# Patient Record
Sex: Female | Born: 1945 | Race: White | Hispanic: No | Smoking: Former smoker
Health system: Southern US, Community
[De-identification: ages and names within clinical notes are randomized; demographics above are authoritative.]

## PROBLEM LIST (undated history)

## (undated) DIAGNOSIS — G459 Transient cerebral ischemic attack, unspecified: Secondary | ICD-10-CM

## (undated) DIAGNOSIS — E785 Hyperlipidemia, unspecified: Secondary | ICD-10-CM

## (undated) DIAGNOSIS — H9313 Tinnitus, bilateral: Secondary | ICD-10-CM

## (undated) DIAGNOSIS — N200 Calculus of kidney: Secondary | ICD-10-CM

## (undated) DIAGNOSIS — I10 Essential (primary) hypertension: Secondary | ICD-10-CM

## (undated) DIAGNOSIS — E119 Type 2 diabetes mellitus without complications: Secondary | ICD-10-CM

## (undated) DIAGNOSIS — I4510 Unspecified right bundle-branch block: Secondary | ICD-10-CM

## (undated) DIAGNOSIS — I639 Cerebral infarction, unspecified: Secondary | ICD-10-CM

## (undated) HISTORY — PX: MENISECTOMY: SHX5181

## (undated) HISTORY — DX: Cerebral infarction, unspecified: I63.9

## (undated) HISTORY — DX: Type 2 diabetes mellitus without complications: E11.9

## (undated) HISTORY — PX: CATARACT EXTRACTION, BILATERAL: SHX1313

## (undated) HISTORY — PX: ABDOMINAL HYSTERECTOMY: SHX81

## (undated) HISTORY — PX: TONSILLECTOMY: SUR1361

## (undated) HISTORY — DX: Essential (primary) hypertension: I10

## (undated) HISTORY — DX: Transient cerebral ischemic attack, unspecified: G45.9

## (undated) HISTORY — PX: OTHER SURGICAL HISTORY: SHX169

---

## 2013-03-22 DIAGNOSIS — I639 Cerebral infarction, unspecified: Secondary | ICD-10-CM

## 2013-03-22 HISTORY — DX: Cerebral infarction, unspecified: I63.9

## 2015-04-03 DIAGNOSIS — I10 Essential (primary) hypertension: Secondary | ICD-10-CM | POA: Diagnosis not present

## 2015-04-03 DIAGNOSIS — Z23 Encounter for immunization: Secondary | ICD-10-CM | POA: Diagnosis not present

## 2015-04-03 DIAGNOSIS — Z79899 Other long term (current) drug therapy: Secondary | ICD-10-CM | POA: Diagnosis not present

## 2015-04-03 DIAGNOSIS — Z6836 Body mass index (BMI) 36.0-36.9, adult: Secondary | ICD-10-CM | POA: Diagnosis not present

## 2015-04-03 DIAGNOSIS — I4510 Unspecified right bundle-branch block: Secondary | ICD-10-CM | POA: Diagnosis not present

## 2015-04-03 DIAGNOSIS — E1149 Type 2 diabetes mellitus with other diabetic neurological complication: Secondary | ICD-10-CM | POA: Diagnosis not present

## 2015-04-03 DIAGNOSIS — E1140 Type 2 diabetes mellitus with diabetic neuropathy, unspecified: Secondary | ICD-10-CM | POA: Diagnosis not present

## 2015-04-03 DIAGNOSIS — M859 Disorder of bone density and structure, unspecified: Secondary | ICD-10-CM | POA: Diagnosis not present

## 2015-04-03 DIAGNOSIS — M8588 Other specified disorders of bone density and structure, other site: Secondary | ICD-10-CM | POA: Diagnosis not present

## 2015-04-03 DIAGNOSIS — G459 Transient cerebral ischemic attack, unspecified: Secondary | ICD-10-CM | POA: Diagnosis not present

## 2015-04-29 ENCOUNTER — Other Ambulatory Visit: Payer: Self-pay

## 2015-04-29 DIAGNOSIS — Z1231 Encounter for screening mammogram for malignant neoplasm of breast: Secondary | ICD-10-CM

## 2015-05-13 DIAGNOSIS — H04123 Dry eye syndrome of bilateral lacrimal glands: Secondary | ICD-10-CM | POA: Diagnosis not present

## 2015-05-13 DIAGNOSIS — H2511 Age-related nuclear cataract, right eye: Secondary | ICD-10-CM | POA: Diagnosis not present

## 2015-05-13 DIAGNOSIS — H2512 Age-related nuclear cataract, left eye: Secondary | ICD-10-CM | POA: Diagnosis not present

## 2015-05-14 ENCOUNTER — Ambulatory Visit
Admission: RE | Admit: 2015-05-14 | Discharge: 2015-05-14 | Disposition: A | Discharge status: Home / Self Care | Payer: Commercial Managed Care - HMO | Source: Ambulatory Visit

## 2015-05-14 DIAGNOSIS — Z1231 Encounter for screening mammogram for malignant neoplasm of breast: Secondary | ICD-10-CM | POA: Diagnosis not present

## 2015-05-28 DIAGNOSIS — H269 Unspecified cataract: Secondary | ICD-10-CM | POA: Diagnosis not present

## 2015-05-28 DIAGNOSIS — H2511 Age-related nuclear cataract, right eye: Secondary | ICD-10-CM | POA: Diagnosis not present

## 2015-07-03 DIAGNOSIS — E559 Vitamin D deficiency, unspecified: Secondary | ICD-10-CM | POA: Diagnosis not present

## 2015-09-09 DIAGNOSIS — H2512 Age-related nuclear cataract, left eye: Secondary | ICD-10-CM | POA: Diagnosis not present

## 2015-09-09 DIAGNOSIS — H04123 Dry eye syndrome of bilateral lacrimal glands: Secondary | ICD-10-CM | POA: Diagnosis not present

## 2015-09-09 DIAGNOSIS — Z961 Presence of intraocular lens: Secondary | ICD-10-CM | POA: Diagnosis not present

## 2015-09-30 DIAGNOSIS — N3000 Acute cystitis without hematuria: Secondary | ICD-10-CM | POA: Diagnosis not present

## 2015-09-30 DIAGNOSIS — N200 Calculus of kidney: Secondary | ICD-10-CM | POA: Diagnosis not present

## 2015-10-06 DIAGNOSIS — I4510 Unspecified right bundle-branch block: Secondary | ICD-10-CM | POA: Diagnosis not present

## 2015-10-06 DIAGNOSIS — R3129 Other microscopic hematuria: Secondary | ICD-10-CM | POA: Diagnosis not present

## 2015-10-06 DIAGNOSIS — M25569 Pain in unspecified knee: Secondary | ICD-10-CM | POA: Diagnosis not present

## 2015-10-06 DIAGNOSIS — Z1211 Encounter for screening for malignant neoplasm of colon: Secondary | ICD-10-CM | POA: Diagnosis not present

## 2015-10-06 DIAGNOSIS — E119 Type 2 diabetes mellitus without complications: Secondary | ICD-10-CM | POA: Diagnosis not present

## 2015-10-06 DIAGNOSIS — M810 Age-related osteoporosis without current pathological fracture: Secondary | ICD-10-CM | POA: Diagnosis not present

## 2015-10-09 ENCOUNTER — Telehealth: Payer: Self-pay | Admitting: Cardiovascular Disease

## 2015-10-09 NOTE — Telephone Encounter (Signed)
Received records from [place] for appointment on 11/11/15 with Dr Oval Linsey.  Records given to [facility] (medical records) for Dr Blenda Mounts schedule on 11/11/15. lp

## 2015-10-28 DIAGNOSIS — H2512 Age-related nuclear cataract, left eye: Secondary | ICD-10-CM | POA: Diagnosis not present

## 2015-10-29 DIAGNOSIS — H25812 Combined forms of age-related cataract, left eye: Secondary | ICD-10-CM | POA: Diagnosis not present

## 2015-10-29 DIAGNOSIS — H2512 Age-related nuclear cataract, left eye: Secondary | ICD-10-CM | POA: Diagnosis not present

## 2015-11-10 NOTE — Progress Notes (Signed)
Cardiology Office Note   Date:  11/11/2015   ID:  Stephanie Luna, DOB 16-Jan-1946, MRN [id number]  PCP:  Stephanie Coma, MD  Cardiologist:   Skeet Latch, MD   Chief Complaint  Patient presents with  . New Patient (Initial Visit)    History of Present Illness: Stephanie Luna is a 70 y.o. female with stroke, diabetes, RBBB,  hypertension, and hyperlipidemia who presents to establish care.  Ms. Holohan had a pre-operative EKG in 2012 and was noted to have a RBBB.  She had a LHC that was reportedly negative for obstructive coronary disease.  She notes occasional episodes of chest pain that she attributes to her hiatal hernia. It occurs randomly and is not associated with with shortness of breath.  It also does not occur with exertion.  She does not get much formal exercise but she walks the dog regularly and does a lot of yard work.  She is limited by knee pain.  Lately [name] has noted increased swelling in her ankles.  She denies orthopnea or PND.    Past Medical History:  Diagnosis Date  . Diabetes mellitus without complication ([place])   . Hypertension     No past surgical history on file.   Current Outpatient Prescriptions  Medication Sig Dispense Refill  . acetaminophen (TYLENOL) 500 MG tablet 2 tablets as needed    . Blood Glucose Monitoring Suppl (TRUE METRIX METER) w/Device KIT as directed    . Calcium Citrate-Vitamin D (CALCIUM CITRATE + D) 315-250 MG-UNIT TABS 1 tablet    . clopidogrel (PLAVIX) 75 MG tablet 1 tablet    . glucose blood (TRUE METRIX BLOOD GLUCOSE TEST) test strip as directed    . ketorolac (ACULAR) 0.4 % SOLN     . losartan (COZAAR) 50 MG tablet 1 tablet    . metoprolol succinate (TOPROL-XL) 50 MG 24 hr tablet TAKE 1 AND 1/2 TABLETS EVERY DAY    . MICROLET LANCETS MISC as directed    . omeprazole (PRILOSEC) 40 MG capsule 1 capsule    . simvastatin (ZOCOR) 40 MG tablet 1 tablet in the evening     No current facility-administered medications  for this visit.     Allergies:   Codeine    Social History:  The patient  reports that she has quit smoking. She does not have any smokeless tobacco history on file.   Family History:  The patient's family history includes Colon cancer in her father.    ROS:  Please see the history of present illness.   Otherwise, review of systems are positive for none.   All other systems are reviewed and negative.    PHYSICAL EXAM: VS:  BP 126/85   Pulse 73   Ht 5' 3.5" (1.613 m)   Wt 210 lb 12.8 oz (95.6 kg)   BMI 36.76 kg/m  , BMI Body mass index is 36.76 kg/m. GENERAL:  Well appearing HEENT:  Pupils equal round and reactive, fundi not visualized, oral mucosa unremarkable NECK:  No jugular venous distention, waveform within normal limits, carotid upstroke brisk and symmetric, no bruits, no thyromegaly LYMPHATICS:  No cervical adenopathy LUNGS:  Clear to auscultation bilaterally HEART:  RRR.  PMI not displaced or sustained,S1 and S2 within normal limits, no S3, no S4, no clicks, no rubs, no murmurs ABD:  Flat, positive bowel sounds normal in frequency in pitch, no bruits, no rebound, no guarding, no midline pulsatile mass, no hepatomegaly, no splenomegaly EXT:  2 plus pulses  throughout, no edema, no cyanosis no clubbing SKIN:  No rashes no nodules NEURO:  Cranial nerves II through XII grossly intact, motor grossly intact throughout PSYCH:  Cognitively intact, oriented to person place and time    EKG:  EKG is ordered today. The ekg ordered today demonstrates sinus rhythm rate 67 bpm.  RBBB.  Low voltage 12/06/2013: Sinus rhythm. Rate 90 bpm. Right bundle branch block.  Lexiscan Myoview 12/06/13:  Echo 08/29/13: LVEF greater than 70%. Mild LVH. Diastolic dysfunction   Recent Labs: No results found for requested labs within last 8760 hours.   7/17: Hemoglobin A1c 7.5%  04/03/15: WBC 6.6, hemoglobin 13.9, hematocrit 42.1, platelets 253 Sodium 139, potassium 4.4, BUN 15, creatinine  0.62 AST 15, ALT 12 TSH 1.44 Total cholesterol 147, triglycerides 175, HDL 46, LDL 66  Lipid Panel No results found for: CHOL, TRIG, HDL, CHOLHDL, VLDL, LDLCALC, LDLDIRECT    Wt Readings from Last 3 Encounters:  11/11/15 210 lb 12.8 oz (95.6 kg)      ASSESSMENT AND PLAN:  # Atypical chest pain: Episodes are attributed to her hiatal hernia.  She denies exertional symptoms and had a normal cath in 2012.  No indication for ischemia evaluation at this time.   # RBBB: Stable since 2012.   # Hypertension: Blood pressure is well-controlled on losartan and metoprolol.    # Hyperlipidemia:  LDL 66 03/2015.  Continue simvastatin.   # Stroke: Continue clopidogrel.  LDL is well-controlled.   Current medicines are reviewed at length with the patient today.  The patient does not have concerns regarding medicines.  The following changes have been made:  no change  Labs/ tests ordered today include:  No orders of the defined types were placed in this encounter.    Disposition:   FU with Brenda Cowher C. Oval Linsey, MD, [facility] in 6 months.    This note was written with the assistance of speech recognition software.  Please excuse any transcriptional errors.  Signed, Jeff Frieden C. Oval Linsey, MD, [facility]  11/11/2015 5:39 PM    [place]

## 2015-11-11 ENCOUNTER — Encounter: Payer: Self-pay | Admitting: Cardiovascular Disease

## 2015-11-11 ENCOUNTER — Encounter (INDEPENDENT_AMBULATORY_CARE_PROVIDER_SITE_OTHER): Payer: Self-pay

## 2015-11-11 ENCOUNTER — Ambulatory Visit (INDEPENDENT_AMBULATORY_CARE_PROVIDER_SITE_OTHER): Payer: Commercial Managed Care - HMO | Admitting: Cardiovascular Disease

## 2015-11-11 VITALS — BP 126/85 | HR 73 | Ht 63.5 in | Wt 210.8 lb

## 2015-11-11 DIAGNOSIS — E785 Hyperlipidemia, unspecified: Secondary | ICD-10-CM

## 2015-11-11 DIAGNOSIS — E119 Type 2 diabetes mellitus without complications: Secondary | ICD-10-CM | POA: Insufficient documentation

## 2015-11-11 DIAGNOSIS — R0789 Other chest pain: Secondary | ICD-10-CM | POA: Diagnosis not present

## 2015-11-11 DIAGNOSIS — I6782 Cerebral ischemia: Secondary | ICD-10-CM | POA: Insufficient documentation

## 2015-11-11 DIAGNOSIS — I10 Essential (primary) hypertension: Secondary | ICD-10-CM | POA: Diagnosis not present

## 2015-11-11 DIAGNOSIS — E1149 Type 2 diabetes mellitus with other diabetic neurological complication: Secondary | ICD-10-CM | POA: Insufficient documentation

## 2015-11-11 DIAGNOSIS — E669 Obesity, unspecified: Secondary | ICD-10-CM | POA: Insufficient documentation

## 2015-11-11 DIAGNOSIS — I4510 Unspecified right bundle-branch block: Secondary | ICD-10-CM | POA: Insufficient documentation

## 2015-11-11 DIAGNOSIS — G458 Other transient cerebral ischemic attacks and related syndromes: Secondary | ICD-10-CM | POA: Diagnosis not present

## 2015-11-11 DIAGNOSIS — G939 Disorder of brain, unspecified: Secondary | ICD-10-CM | POA: Insufficient documentation

## 2015-11-11 NOTE — Patient Instructions (Signed)

## 2015-11-12 ENCOUNTER — Encounter: Payer: Commercial Managed Care - HMO | Attending: Family Medicine | Admitting: *Deleted

## 2015-11-12 DIAGNOSIS — I4510 Unspecified right bundle-branch block: Secondary | ICD-10-CM | POA: Insufficient documentation

## 2015-11-12 DIAGNOSIS — E119 Type 2 diabetes mellitus without complications: Secondary | ICD-10-CM

## 2015-11-12 DIAGNOSIS — Z713 Dietary counseling and surveillance: Secondary | ICD-10-CM | POA: Diagnosis not present

## 2015-11-12 NOTE — Progress Notes (Signed)
Diabetes Self-Management Education  Visit Type: First/Initial  Appt. Start Time: 0830 Appt. End Time: 1000  11/12/2015  [name], identified by name and date of birth, is a 70 y.o. female with a diagnosis of Diabetes: Type 2.   ASSESSMENT  Height 5' 3.5" (1.613 m), weight 211 lb 12.8 oz (96.1 kg). Body mass index is 36.93 kg/m.      Diabetes Self-Management Education - 11/12/15 [id number]      Visit Information   Visit Type First/Initial     Initial Visit   Diabetes Type Type 2   Are you currently following a meal plan? No   Are you taking your medications as prescribed? Not on Medications   Date Diagnosed 08/2014     Health Coping   How would you rate your overall health? Good     Psychosocial Assessment   Patient Belief/Attitude about Diabetes Motivated to manage diabetes   Self-care barriers None   Other persons present Patient;Family Member   Special Needs None   How often do you need to have someone help you when you read instructions, pamphlets, or other written materials from your doctor or pharmacy? 1 - Never   What is the last grade level you completed in school? 12     Pre-Education Assessment   Patient understands the diabetes disease and treatment process. (P)  Needs Review   Patient understands incorporating nutritional management into lifestyle. (P)  Needs Review   Patient undertands incorporating physical activity into lifestyle. (P)  Demonstrates understanding / competency   Patient understands using medications safely. (P)  Needs Review   Patient understands monitoring blood glucose, interpreting and using results (P)  Needs Review   Patient understands prevention, detection, and treatment of acute complications. (P)  Needs Review   Patient understands prevention, detection, and treatment of chronic complications. (P)  Needs Review   Patient understands how to develop strategies to address psychosocial issues. (P)  Needs Review   Patient understands  how to develop strategies to promote health/change behavior. (P)  Needs Review     Complications   Last HgB A1C per patient/outside source 7.5 %   How often do you check your blood sugar? 1-2 times/day   Fasting Blood glucose range (mg/dL) (P)  130-179;70-129   Number of hypoglycemic episodes per month (P)  0   Have you had a dilated eye exam in the past 12 months? Yes   Have you had a dental exam in the past 12 months? Yes   Are you checking your feet? Yes   How many days per week are you checking your feet? 7     Dietary Intake   Breakfast (P)  Cheerios with 2% milk, OR twice a week have eggs, grits, bacon or sausage and toast with butter   Snack (morning) (P)  not usually    Lunch (P)  sandwich with chips    Snack (afternoon) (P)  occasionally cookies rarely, ice cream if working outside and [employer] (P)  lean meat, vegetables, starch, (limits bread)   Snack (evening) (P)  occasionally crackers with cheese    Beverage(s) (P)  decaf coffee, green decaf tea with sweet and low     Exercise   Exercise Type Light (walking / raking leaves)  walks dog 20 minutes 2 - 3 times every day   How many days per week to you exercise? 7   How many minutes per day do you exercise? 60   Total  minutes per week of exercise 420     Patient Education   Previous Diabetes Education Yes (please comment)  09/2014      Individualized Plan for Diabetes Self-Management Training:   Learning Objective:  Patient will have a greater understanding of diabetes self-management. Patient education plan is to attend individual and/or group sessions per assessed needs and concerns.   Plan:   Patient Instructions  Plan:  Aim for 3 Carb Choices per meal (45 grams) +/- 1 either way  Aim for 0-2 Carbs per snack if hungry  Include protein in moderation with your meals and snacks Consider reading food labels for Total Carbohydrate of foods Continue with your activity level by waling daily as  tolerated Consider checking BG at alternate times per day        Expected Outcomes:     Education material provided: Living Well with Diabetes, A1C conversion sheet, Meal plan card, Snack sheet and Carbohydrate counting sheet, diabetes Medication Sheet  If problems or questions, patient to contact team via:  Phone and Email  Future DSME appointment:

## 2015-11-12 NOTE — Patient Instructions (Signed)
Plan:  Aim for 3 Carb Choices per meal (45 grams) +/- 1 either way  Aim for 0-2 Carbs per snack if hungry  Include protein in moderation with your meals and snacks Consider reading food labels for Total Carbohydrate of foods Continue with your activity level by waling daily as tolerated Consider checking BG at alternate times per day

## 2015-11-14 ENCOUNTER — Encounter: Payer: Self-pay | Admitting: *Deleted

## 2015-11-19 ENCOUNTER — Encounter: Payer: Self-pay | Admitting: Family Medicine

## 2015-12-10 DIAGNOSIS — M1712 Unilateral primary osteoarthritis, left knee: Secondary | ICD-10-CM | POA: Diagnosis not present

## 2015-12-10 DIAGNOSIS — M25562 Pain in left knee: Secondary | ICD-10-CM | POA: Diagnosis not present

## 2016-01-05 DIAGNOSIS — R197 Diarrhea, unspecified: Secondary | ICD-10-CM | POA: Diagnosis not present

## 2016-01-05 DIAGNOSIS — Z86010 Personal history of colonic polyps: Secondary | ICD-10-CM | POA: Diagnosis not present

## 2016-01-05 DIAGNOSIS — Z800 Family history of malignant neoplasm of digestive organs: Secondary | ICD-10-CM | POA: Diagnosis not present

## 2016-01-06 DIAGNOSIS — Z23 Encounter for immunization: Secondary | ICD-10-CM | POA: Diagnosis not present

## 2016-01-06 DIAGNOSIS — E119 Type 2 diabetes mellitus without complications: Secondary | ICD-10-CM | POA: Diagnosis not present

## 2016-01-06 DIAGNOSIS — M549 Dorsalgia, unspecified: Secondary | ICD-10-CM | POA: Diagnosis not present

## 2016-01-06 DIAGNOSIS — Z86010 Personal history of colonic polyps: Secondary | ICD-10-CM | POA: Diagnosis not present

## 2016-01-06 DIAGNOSIS — R197 Diarrhea, unspecified: Secondary | ICD-10-CM | POA: Diagnosis not present

## 2016-01-23 ENCOUNTER — Telehealth: Payer: Self-pay | Admitting: Cardiovascular Disease

## 2016-01-23 ENCOUNTER — Encounter: Payer: Self-pay | Admitting: *Deleted

## 2016-01-23 ENCOUNTER — Encounter: Payer: Self-pay | Admitting: Physician Assistant

## 2016-01-23 NOTE — Telephone Encounter (Signed)
Communication signed by Suanne Marker and faxed to requesting provider.

## 2016-01-23 NOTE — Telephone Encounter (Signed)
Spoke w Margreta Journey and advised I would review w provider here today for clearance.  She provided me w direct line to contact her w notification.  Rhonda Barrett reviewed - OK to hold plavix for 5-7 days for proc as requested. I called Christina back to inform and left msg w my direction extension.

## 2016-01-23 NOTE — Telephone Encounter (Signed)
New message       Request for surgical clearance:  1. What type of surgery is being performed?  colonoscopy  2. When is this surgery scheduled?  01-29-16  3. Are there any medications that need to be held prior to surgery and how long? Hold plavix 5-7 days prior----hx of polyps  Name of physician performing surgery? Dr Paulita Fujita 4. What is your office phone and fax number? Fax [phone]

## 2016-01-29 DIAGNOSIS — K635 Polyp of colon: Secondary | ICD-10-CM | POA: Diagnosis not present

## 2016-01-29 DIAGNOSIS — K621 Rectal polyp: Secondary | ICD-10-CM | POA: Diagnosis not present

## 2016-01-29 DIAGNOSIS — R197 Diarrhea, unspecified: Secondary | ICD-10-CM | POA: Diagnosis not present

## 2016-01-29 DIAGNOSIS — K5730 Diverticulosis of large intestine without perforation or abscess without bleeding: Secondary | ICD-10-CM | POA: Diagnosis not present

## 2016-01-29 DIAGNOSIS — Z800 Family history of malignant neoplasm of digestive organs: Secondary | ICD-10-CM | POA: Diagnosis not present

## 2016-01-29 DIAGNOSIS — Z86010 Personal history of colonic polyps: Secondary | ICD-10-CM | POA: Diagnosis not present

## 2016-02-03 DIAGNOSIS — K635 Polyp of colon: Secondary | ICD-10-CM | POA: Diagnosis not present

## 2016-03-04 DIAGNOSIS — R1013 Epigastric pain: Secondary | ICD-10-CM | POA: Diagnosis not present

## 2016-03-04 DIAGNOSIS — R197 Diarrhea, unspecified: Secondary | ICD-10-CM | POA: Diagnosis not present

## 2016-03-09 ENCOUNTER — Encounter (HOSPITAL_COMMUNITY): Payer: Self-pay

## 2016-03-09 ENCOUNTER — Emergency Department (HOSPITAL_COMMUNITY)
Admission: EM | Admit: 2016-03-09 | Discharge: 2016-03-10 | Disposition: A | Discharge status: Home / Self Care | Payer: Commercial Managed Care - HMO | Attending: Emergency Medicine | Admitting: Emergency Medicine

## 2016-03-09 ENCOUNTER — Emergency Department (HOSPITAL_COMMUNITY): Payer: Commercial Managed Care - HMO

## 2016-03-09 DIAGNOSIS — N132 Hydronephrosis with renal and ureteral calculous obstruction: Secondary | ICD-10-CM | POA: Insufficient documentation

## 2016-03-09 DIAGNOSIS — Z79899 Other long term (current) drug therapy: Secondary | ICD-10-CM | POA: Diagnosis not present

## 2016-03-09 DIAGNOSIS — Z8673 Personal history of transient ischemic attack (TIA), and cerebral infarction without residual deficits: Secondary | ICD-10-CM | POA: Diagnosis not present

## 2016-03-09 DIAGNOSIS — N200 Calculus of kidney: Secondary | ICD-10-CM

## 2016-03-09 DIAGNOSIS — Z87891 Personal history of nicotine dependence: Secondary | ICD-10-CM | POA: Insufficient documentation

## 2016-03-09 DIAGNOSIS — E119 Type 2 diabetes mellitus without complications: Secondary | ICD-10-CM | POA: Diagnosis not present

## 2016-03-09 DIAGNOSIS — I10 Essential (primary) hypertension: Secondary | ICD-10-CM | POA: Insufficient documentation

## 2016-03-09 DIAGNOSIS — R1032 Left lower quadrant pain: Secondary | ICD-10-CM | POA: Diagnosis present

## 2016-03-09 HISTORY — DX: Calculus of kidney: N20.0

## 2016-03-09 LAB — CBC
HCT: 37.5 % (ref 36.0–46.0)
Hemoglobin: 12.7 g/dL (ref 12.0–15.0)
MCH: 30.2 pg (ref 26.0–34.0)
MCHC: 33.9 g/dL (ref 30.0–36.0)
MCV: 89.3 fL (ref 78.0–100.0)
Platelets: 145 10*3/uL — ABNORMAL LOW (ref 150–400)
RBC: 4.2 MIL/uL (ref 3.87–5.11)
RDW: 12.9 % (ref 11.5–15.5)
WBC: 9 10*3/uL (ref 4.0–10.5)

## 2016-03-09 LAB — URINALYSIS, ROUTINE W REFLEX MICROSCOPIC
Bilirubin Urine: NEGATIVE
Glucose, UA: 50 mg/dL — AB
Ketones, ur: 20 mg/dL — AB
Leukocytes, UA: NEGATIVE
Nitrite: NEGATIVE
Protein, ur: NEGATIVE mg/dL
Specific Gravity, Urine: 1.012 (ref 1.005–1.030)
pH: 6 (ref 5.0–8.0)

## 2016-03-09 LAB — COMPREHENSIVE METABOLIC PANEL
ALT: 16 U/L (ref 14–54)
AST: 30 U/L (ref 15–41)
Albumin: 4.1 g/dL (ref 3.5–5.0)
Alkaline Phosphatase: 90 U/L (ref 38–126)
Anion gap: 12 (ref 5–15)
BUN: 12 mg/dL (ref 6–20)
CO2: 21 mmol/L — ABNORMAL LOW (ref 22–32)
Calcium: 9.4 mg/dL (ref 8.9–10.3)
Chloride: 108 mmol/L (ref 101–111)
Creatinine, Ser: 0.72 mg/dL (ref 0.44–1.00)
GFR calc Af Amer: >60 mL/min (ref >60)
GFR calc non Af Amer: >60 mL/min (ref >60)
Glucose, Bld: 130 mg/dL — ABNORMAL HIGH (ref 65–99)
Potassium: 4.6 mmol/L (ref 3.5–5.1)
Sodium: 141 mmol/L (ref 135–145)
Total Bilirubin: 1.7 mg/dL — ABNORMAL HIGH (ref 0.3–1.2)
Total Protein: 6.6 g/dL (ref 6.5–8.1)

## 2016-03-09 LAB — LIPASE, BLOOD: Lipase: 23 U/L (ref 11–51)

## 2016-03-09 MED ORDER — ONDANSETRON 4 MG PO TBDP
ORAL_TABLET | ORAL | Status: AC
  Administered 2016-03-09: 4 mg via ORAL
  Filled 2016-03-09: qty 1

## 2016-03-09 MED ORDER — KETOROLAC TROMETHAMINE 30 MG/ML IJ SOLN
15.0000 mg | Freq: Once | INTRAMUSCULAR | Status: AC
  Administered 2016-03-09: 15 mg via INTRAVENOUS
  Filled 2016-03-09: qty 1

## 2016-03-09 MED ORDER — FENTANYL CITRATE (PF) 100 MCG/2ML IJ SOLN
50.0000 ug | INTRAMUSCULAR | Status: DC | PRN
  Administered 2016-03-09: 50 ug via INTRAVENOUS
  Filled 2016-03-09: qty 2

## 2016-03-09 MED ORDER — HYDROCODONE-ACETAMINOPHEN 5-325 MG PO TABS: 1.0000 | ORAL_TABLET | ORAL | 0 refills | Status: DC | PRN

## 2016-03-09 MED ORDER — IBUPROFEN 800 MG PO TABS: 800.0000 mg | ORAL_TABLET | Freq: Four times a day (QID) | ORAL | 0 refills | Status: DC | PRN

## 2016-03-09 MED ORDER — ONDANSETRON 4 MG PO TBDP: 4.0000 mg | ORAL_TABLET | Freq: Three times a day (TID) | ORAL | 0 refills | Status: DC | PRN

## 2016-03-09 MED ORDER — HYDROCODONE-ACETAMINOPHEN 5-325 MG PO TABS
1.0000 | ORAL_TABLET | Freq: Once | ORAL | Status: AC
  Administered 2016-03-09: 1 via ORAL
  Filled 2016-03-09: qty 1

## 2016-03-09 MED ORDER — FENTANYL CITRATE (PF) 100 MCG/2ML IJ SOLN
INTRAMUSCULAR | Status: AC
  Administered 2016-03-09: 18:00:00
  Filled 2016-03-09: qty 2

## 2016-03-09 MED ORDER — ONDANSETRON HCL 4 MG/2ML IJ SOLN
4.0000 mg | INTRAMUSCULAR | Status: DC | PRN
  Administered 2016-03-09: 4 mg via INTRAVENOUS
  Filled 2016-03-09: qty 2

## 2016-03-09 MED ORDER — ONDANSETRON 4 MG PO TBDP
4.0000 mg | ORAL_TABLET | Freq: Once | ORAL | Status: AC | PRN
  Administered 2016-03-09: 4 mg via ORAL

## 2016-03-09 NOTE — ED Notes (Signed)
EDP at bedside updating pt 

## 2016-03-09 NOTE — ED Notes (Signed)
Pt oxygen levels noted to drop to 83% on RA. This RN placed pt on 2L [place] and saturations improved to 94%. Will cont to monitor.

## 2016-03-09 NOTE — ED Triage Notes (Signed)
Per Pt, Pt is coming from home with complaints of sharp LLQ abdominal pain that started this morning. Pt started to vomit around 1200 and reports relief. Pt ate around 1330 and reports pain and vomiting returning.

## 2016-03-09 NOTE — ED Notes (Signed)
Pt actively vomiting.

## 2016-03-09 NOTE — ED Notes (Signed)
This RN attempted IV x2  

## 2016-03-09 NOTE — ED Notes (Signed)
Pt accidentally pulled out IV while using the bathroom

## 2016-03-09 NOTE — ED Provider Notes (Signed)
[facility] DEPT Provider Note   CSN: [id number] Arrival date & time: 03/09/16  1652     History   Chief Complaint Chief Complaint  Patient presents with  . Abdominal Pain    HPI Stephanie Luna is a 70 y.o. female.  The history is provided by the patient.  Abdominal Pain   This is a new problem. The current episode started 6 to 12 hours ago. The problem occurs hourly. Progression since onset: waxing and waning. The pain is located in the LLQ. The quality of the pain is sharp. The pain is moderate. Pertinent negatives include fever. Nothing aggravates the symptoms. Nothing relieves the symptoms. Past medical history comments: kidney stones remotely.    Past Medical History:  Diagnosis Date  . CVA (cerebral vascular accident) ([place]) 2015   L parietal lobe, other ischemic infarcts seen, no occlusive vascular disease mentioned  . Diabetes mellitus without complication ([place])   . Hypertension   . Kidney stones     Patient Active Problem List   Diagnosis Date Noted  . Neurologic disorder associated with diabetes mellitus ([place]) 11/11/2015  . BP (high blood pressure) 11/11/2015  . Morbid obesity ([place]) 11/11/2015  . Bundle branch block, right 11/11/2015  . Temporary cerebral vascular dysfunction 11/11/2015  . Controlled type 2 diabetes mellitus without complication ([place]) 40/98/1191    Past Surgical History:  Procedure Laterality Date  . ABDOMINAL HYSTERECTOMY    . Colocystectomy     . MENISECTOMY    . TONSILLECTOMY      OB History    No data available       Home Medications    Prior to Admission medications   Medication Sig Start Date End Date Taking? Authorizing Provider  acetaminophen (TYLENOL) 500 MG tablet 2 tablets as needed    Historical Provider, MD  Blood Glucose Monitoring Suppl (TRUE METRIX METER) w/Device KIT as directed 08/04/15   Historical Provider, MD  Calcium Citrate-Vitamin D (CALCIUM CITRATE + D) 315-250 MG-UNIT TABS 1 tablet    Historical  Provider, MD  clopidogrel (PLAVIX) 75 MG tablet 1 tablet    Historical Provider, MD  glucose blood (TRUE METRIX BLOOD GLUCOSE TEST) test strip as directed 08/04/15   Historical Provider, MD  ketorolac (ACULAR) 0.4 % SOLN  11/09/15   Historical Provider, MD  losartan (COZAAR) 50 MG tablet 1 tablet    Historical Provider, MD  metoprolol succinate (TOPROL-XL) 50 MG 24 hr tablet TAKE 1 AND 1/2 TABLETS EVERY DAY    Historical Provider, MD  [place] as directed 07/21/15   Historical Provider, MD  omeprazole (PRILOSEC) 40 MG capsule 1 capsule    Historical Provider, MD  simvastatin (ZOCOR) 40 MG tablet 1 tablet in the evening    Historical Provider, MD    Family History Family History  Problem Relation Age of Onset  . Colon cancer Father     Social History Social History  Substance Use Topics  . Smoking status: Former Research scientist (life sciences)  . Smokeless tobacco: Never Used  . Alcohol use No     Allergies   Codeine and Metformin and related   Review of Systems Review of Systems  Constitutional: Negative for fever.  Gastrointestinal: Positive for abdominal pain.  All other systems reviewed and are negative.    Physical Exam Updated Vital Signs BP (!) 190/104 (BP Location: Right Arm)   Pulse 82   Temp 98 F (36.7 C) (Oral)   Resp 18   Ht '5\' 4"'  (1.626 m)  Wt 200 lb (90.7 kg)   SpO2 98%   BMI 34.33 kg/m   Physical Exam  Constitutional: She is oriented to person, place, and time. She appears well-developed and well-nourished. No distress.  HENT:  Head: Normocephalic.  Nose: Nose normal.  Eyes: Conjunctivae are normal.  Neck: Neck supple. No tracheal deviation present.  Cardiovascular: Normal rate, regular rhythm and normal heart sounds.   Pulmonary/Chest: Effort normal and breath sounds normal. No respiratory distress.  Abdominal: Soft. She exhibits no distension. There is tenderness (llq and left cva). There is no rebound and no guarding.  Neurological: She is alert and  oriented to person, place, and time.  Skin: Skin is warm and dry.  Psychiatric: She has a normal mood and affect.  Vitals reviewed.    ED Treatments / Results  Labs (all labs ordered are listed, but only abnormal results are displayed) Labs Reviewed  URINALYSIS, ROUTINE W REFLEX MICROSCOPIC - Abnormal; Notable for the following:       Result Value   Color, Urine STRAW (*)    Glucose, UA 50 (*)    Hgb urine dipstick LARGE (*)    Ketones, ur 20 (*)    Bacteria, UA RARE (*)    Squamous Epithelial / LPF 0-5 (*)    All other components within normal limits  COMPREHENSIVE METABOLIC PANEL - Abnormal; Notable for the following:    CO2 21 (*)    Glucose, Bld 130 (*)    Total Bilirubin 1.7 (*)    All other components within normal limits  CBC - Abnormal; Notable for the following:    Platelets 145 (*)    All other components within normal limits  URINE CULTURE  LIPASE, BLOOD    EKG  EKG Interpretation None       Radiology Ct Renal Stone Study  Result Date: 03/09/2016 CLINICAL DATA:  Initial evaluation for acute left flank pain. EXAM: CT ABDOMEN AND PELVIS WITHOUT CONTRAST TECHNIQUE: Multidetector CT imaging of the abdomen and pelvis was performed following the standard protocol without IV contrast. COMPARISON:  None available. FINDINGS: Lower chest: Mild atelectatic changes present dependently within the visualized lung bases. Visualized lungs are otherwise clear. Hepatobiliary: Liver demonstrates a normal unenhanced appearance. Gallbladder surgically absent. Prominence of the common bile duct like related to post cholecystectomy changes. Pancreas: Pancreas within normal limits. Spleen: Spleen within normal limits. Adrenals/Urinary Tract: 2.6 cm hypodense left adrenal nodule, most compatible with a small adenoma. Right adrenal gland unremarkable. Punctate hyperdensity within the interpolar right kidney likely reflects a tiny nonobstructive stone (series 2, image 36). No radiopaque  calculi seen along the course of the right renal collecting system. No right-sided hydronephrosis or hydroureter. Subcentimeter hyperdense lesion within the parenchyma the interpolar right kidney may reflect a tiny proteinaceous and/ or hemorrhagic cyst, too small the characterize on this exam. On the left, there is an obstructive 4 mm stone within the proximal left ureter with secondary mild left hydroureteronephrosis. Additional punctate nonobstructive stone present within the lower pole left kidney. No other calculi seen within the left ureter. Left-sided perinephric and periureteral fat stranding noted. No made of a 2.3 cm left renal cyst. Bladder within normal limits. No layering stones within the bladder lumen. Stomach/Bowel: Stomach within normal limits. No evidence for bowel obstruction. Colonic diverticulosis without evidence for acute diverticulitis. No acute inflammatory changes seen about the bowels. Vascular/Lymphatic: Mild scattered atheromatous plaque within the intra- abdominal aorta. No aneurysm. No adenopathy. Reproductive: Uterus is absent.  Ovaries not discretely  identified. Other: No free air or fluid. Tiny fat containing paraumbilical hernia noted. Musculoskeletal: No acute osseous abnormality. Compression deformity involving the T10 and T12 vertebral bodies appear chronic in nature. 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 No worrisome lytic or blastic osseous lesions. IMPRESSION: 1. 4 mm obstructive stone within the proximal left ureter with secondary mild left hydroureteronephrosis. 2. Additional nonobstructive bilateral renal calculi as above. 3. Colonic diverticulosis without evidence for acute diverticulitis. 4. Chronic compression deformities involving the T10 and T12 vertebral bodies. 5. 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 Electronically Signed   By: Jeannine Boga M.D.   On: 03/09/2016 22:54     Procedures Procedures (including critical care time)  Medications Ordered in ED Medications  fentaNYL (SUBLIMAZE) injection 50 mcg (not administered)  ondansetron (ZOFRAN) injection 4 mg (not administered)  ondansetron (ZOFRAN-ODT) disintegrating tablet 4 mg (4 mg Oral Given 03/09/16 1704)  fentaNYL (SUBLIMAZE) 100 MCG/2ML injection (  Given 03/09/16 1757)     Initial Impression / Assessment and Plan / ED Course  I have reviewed the triage vital signs and the nursing notes.  Pertinent labs & imaging results that were available during my care of the patient were reviewed by me and considered in my medical decision making (see chart for details).  Clinical Course    70 y.o. female presents with LLQ abdominal pain and flank pain. She has had kidney stones previously and this feels similar. 59m stone noted on CT, pain well controlled with fentanyl and toradol, no evidence of infection and renal function preserved. Norco and ibuprofen for pain control at home with zofran for nausea to attempt trial of passage. Provided referral to urology for monitoring of symptoms and consideration of intervention if she is unable to pass spontaneously.   Final Clinical Impressions(s) / ED Diagnoses   Final diagnoses:  Kidney stone on left side    New Prescriptions Discharge Medication List as of 03/09/2016 11:19 PM    START taking these medications   Details  HYDROcodone-acetaminophen (NORCO/VICODIN) 5-325 MG tablet Take 1 tablet by mouth every 4 (four) hours as needed for severe pain., Starting Tue 03/09/2016, Print    ibuprofen (ADVIL,MOTRIN) 800 MG tablet Take 1 tablet (800 mg total) by mouth every 6 (six) hours as needed., Starting Tue 03/09/2016, Print    ondansetron (ZOFRAN ODT) 4 MG disintegrating tablet Take 1 tablet (4 mg total) by mouth every 8 (eight) hours as needed for nausea or vomiting., Starting Tue 03/09/2016, Print         DLeo Grosser MD 03/10/16 [id number]

## 2016-03-11 LAB — URINE CULTURE

## 2016-03-12 ENCOUNTER — Encounter (HOSPITAL_COMMUNITY): Payer: Self-pay | Admitting: Emergency Medicine

## 2016-03-12 ENCOUNTER — Emergency Department (HOSPITAL_COMMUNITY)
Admission: EM | Admit: 2016-03-12 | Discharge: 2016-03-12 | Disposition: A | Discharge status: Home / Self Care | Payer: Commercial Managed Care - HMO | Attending: Emergency Medicine | Admitting: Emergency Medicine

## 2016-03-12 DIAGNOSIS — E119 Type 2 diabetes mellitus without complications: Secondary | ICD-10-CM | POA: Insufficient documentation

## 2016-03-12 DIAGNOSIS — Z87891 Personal history of nicotine dependence: Secondary | ICD-10-CM | POA: Insufficient documentation

## 2016-03-12 DIAGNOSIS — I10 Essential (primary) hypertension: Secondary | ICD-10-CM | POA: Insufficient documentation

## 2016-03-12 DIAGNOSIS — N200 Calculus of kidney: Secondary | ICD-10-CM | POA: Diagnosis not present

## 2016-03-12 DIAGNOSIS — R10A0 Flank pain, unspecified side: Secondary | ICD-10-CM

## 2016-03-12 DIAGNOSIS — Z79899 Other long term (current) drug therapy: Secondary | ICD-10-CM | POA: Diagnosis not present

## 2016-03-12 DIAGNOSIS — R109 Unspecified abdominal pain: Secondary | ICD-10-CM

## 2016-03-12 DIAGNOSIS — R1032 Left lower quadrant pain: Secondary | ICD-10-CM | POA: Diagnosis not present

## 2016-03-12 DIAGNOSIS — Z8673 Personal history of transient ischemic attack (TIA), and cerebral infarction without residual deficits: Secondary | ICD-10-CM | POA: Insufficient documentation

## 2016-03-12 DIAGNOSIS — Z7902 Long term (current) use of antithrombotics/antiplatelets: Secondary | ICD-10-CM | POA: Diagnosis not present

## 2016-03-12 LAB — URINALYSIS, ROUTINE W REFLEX MICROSCOPIC
Bilirubin Urine: NEGATIVE
Glucose, UA: NEGATIVE mg/dL
Ketones, ur: NEGATIVE mg/dL
Nitrite: NEGATIVE
Protein, ur: 30 mg/dL — AB
Specific Gravity, Urine: 1.015 (ref 1.005–1.030)
pH: 5 (ref 5.0–8.0)

## 2016-03-12 LAB — BASIC METABOLIC PANEL
Anion gap: 9 (ref 5–15)
BUN: 20 mg/dL (ref 6–20)
CO2: 28 mmol/L (ref 22–32)
Calcium: 8.8 mg/dL — ABNORMAL LOW (ref 8.9–10.3)
Chloride: 99 mmol/L — ABNORMAL LOW (ref 101–111)
Creatinine, Ser: 1.21 mg/dL — ABNORMAL HIGH (ref 0.44–1.00)
GFR calc Af Amer: 51 mL/min — ABNORMAL LOW (ref >60)
GFR calc non Af Amer: 44 mL/min — ABNORMAL LOW (ref >60)
Glucose, Bld: 160 mg/dL — ABNORMAL HIGH (ref 65–99)
Potassium: 4.1 mmol/L (ref 3.5–5.1)
Sodium: 136 mmol/L (ref 135–145)

## 2016-03-12 LAB — CBC
HCT: 41.4 % (ref 36.0–46.0)
Hemoglobin: 13.7 g/dL (ref 12.0–15.0)
MCH: 30 pg (ref 26.0–34.0)
MCHC: 33.1 g/dL (ref 30.0–36.0)
MCV: 90.6 fL (ref 78.0–100.0)
Platelets: 240 10*3/uL (ref 150–400)
RBC: 4.57 MIL/uL (ref 3.87–5.11)
RDW: 13.1 % (ref 11.5–15.5)
WBC: 10.3 10*3/uL (ref 4.0–10.5)

## 2016-03-12 MED ORDER — HYDROCODONE-ACETAMINOPHEN 5-325 MG PO TABS
1.0000 | ORAL_TABLET | Freq: Once | ORAL | Status: AC
  Administered 2016-03-12: 1 via ORAL
  Filled 2016-03-12: qty 1

## 2016-03-12 MED ORDER — ONDANSETRON 4 MG PO TBDP
4.0000 mg | ORAL_TABLET | Freq: Once | ORAL | Status: AC
  Administered 2016-03-12: 4 mg via ORAL
  Filled 2016-03-12: qty 1

## 2016-03-12 MED ORDER — ONDANSETRON 4 MG PO TBDP
ORAL_TABLET | ORAL | Status: AC
  Filled 2016-03-12: qty 1

## 2016-03-12 MED ORDER — SODIUM CHLORIDE 0.9 % IV BOLUS (SEPSIS)
1000.0000 mL | Freq: Once | INTRAVENOUS | Status: AC
  Administered 2016-03-12: 1000 mL via INTRAVENOUS

## 2016-03-12 MED ORDER — FENTANYL CITRATE (PF) 100 MCG/2ML IJ SOLN
INTRAMUSCULAR | Status: AC
Start: 2016-03-12 — End: 2016-03-12
  Administered 2016-03-12: 50 ug via NASAL
  Filled 2016-03-12: qty 2

## 2016-03-12 MED ORDER — ONDANSETRON 4 MG PO TBDP
4.0000 mg | ORAL_TABLET | Freq: Once | ORAL | Status: AC | PRN
  Administered 2016-03-12: 4 mg via ORAL

## 2016-03-12 MED ORDER — HYDROCODONE-ACETAMINOPHEN 5-325 MG PO TABS: 1.0000 | ORAL_TABLET | Freq: Four times a day (QID) | ORAL | 0 refills | Status: DC | PRN

## 2016-03-12 MED ORDER — FENTANYL CITRATE (PF) 100 MCG/2ML IJ SOLN
50.0000 ug | Freq: Once | INTRAMUSCULAR | Status: AC
  Administered 2016-03-12: 50 ug via NASAL

## 2016-03-12 NOTE — ED Provider Notes (Signed)
[facility] DEPT Provider Note   CSN: [id number] Arrival date & time: 03/12/16  1220     History   Chief Complaint Chief Complaint  Patient presents with  . Flank Pain  . Nephrolithiasis    HPI Stephanie Luna is a 70 y.o. female.  The history is provided by the patient.  Flank Pain  This is a recurrent problem. The current episode started more than 2 days ago. The problem has been gradually worsening. Associated symptoms include abdominal pain. Pertinent negatives include no chest pain, no headaches and no shortness of breath. Treatments tried: norco and nsaids. The treatment provided moderate relief.  Abdominal Pain   This is a recurrent problem. The current episode started more than 2 days ago. The problem has been gradually worsening. The pain is located in the LLQ. The pain is severe. Associated symptoms include nausea, vomiting and hematuria. Pertinent negatives include fever, diarrhea, hematochezia, melena, dysuria and headaches. Past workup includes CT scan (had kidney stones).  -seen on 12/19 and found to have 51m obstructive stone -5-6 episodes of emesis since then and poor PO intake  Past Medical History:  Diagnosis Date  . CVA (cerebral vascular accident) ([facility] 2015   L parietal lobe, other ischemic infarcts seen, no occlusive vascular disease mentioned  . [place] mellitus without complication ([place]   . Hypertension   . Kidney stones     Patient Active Problem List   Diagnosis Date Noted  . Neurologic disorder associated with diabetes mellitus ([facility] 11/11/2015  . BP (high blood pressure) 11/11/2015  . Morbid obesity ([facility] 11/11/2015  . Bundle branch block, right 11/11/2015  . Temporary cerebral vascular dysfunction 11/11/2015  . Controlled type 2 diabetes mellitus without complication ([facility] 093/79/[id number]   Past Surgical History:  Procedure Laterality Date  . ABDOMINAL HYSTERECTOMY    . Colocystectomy     . MENISECTOMY    . TONSILLECTOMY      OB  History    No data available       Home Medications    Prior to Admission medications   Medication Sig Start Date End Date Taking? Authorizing Provider  acetaminophen (TYLENOL) 500 MG tablet 2 tablets as needed    Historical Provider, MD  Blood Glucose Monitoring Suppl (TRUE METRIX METER) w/Device KIT as directed 08/04/15   Historical Provider, MD  Calcium Citrate-Vitamin D (CALCIUM CITRATE + D) 315-250 MG-UNIT TABS 1 tablet    Historical Provider, MD  clopidogrel (PLAVIX) 75 MG tablet 1 tablet    Historical Provider, MD  glucose blood (TRUE METRIX BLOOD GLUCOSE TEST) test strip as directed 08/04/15   Historical Provider, MD  HYDROcodone-acetaminophen (NORCO/VICODIN) 5-325 MG tablet Take 1 tablet by mouth every 4 (four) hours as needed for severe pain. 03/09/16   DLeo Grosser MD  ibuprofen (ADVIL,MOTRIN) 800 MG tablet Take 1 tablet (800 mg total) by mouth every 6 (six) hours as needed. 03/09/16   DLeo Grosser MD  losartan (COZAAR) 50 MG tablet 1 tablet    Historical Provider, MD  metoprolol succinate (TOPROL-XL) 50 MG 24 hr tablet TAKE 1 AND 1/2 TABLETS EVERY DAY    Historical Provider, MD  omeprazole (PRILOSEC) 40 MG capsule 1 capsule    Historical Provider, MD  ondansetron (ZOFRAN ODT) 4 MG disintegrating tablet Take 1 tablet (4 mg total) by mouth every 8 (eight) hours as needed for nausea or vomiting. 03/09/16   DLeo Grosser MD  simvastatin (ZOCOR) 40 MG tablet 1 tablet in the evening    Historical  Provider, MD    Family History Family History  Problem Relation Age of Onset  . Colon cancer Father     Social History Social History  Substance Use Topics  . Smoking status: Former Research scientist (life sciences)  . Smokeless tobacco: Never Used  . Alcohol use No     Allergies   Codeine and Metformin and related   Review of Systems Review of Systems  Constitutional: Negative for chills and fever.  HENT: Negative.   Respiratory: Negative for shortness of breath.   Cardiovascular: Negative for  chest pain.  Gastrointestinal: Positive for abdominal pain, nausea and vomiting. Negative for abdominal distention, diarrhea, hematochezia and melena.  Genitourinary: Positive for flank pain and hematuria. Negative for dysuria.  Neurological: Negative for headaches.  All other systems reviewed and are negative.    Physical Exam Updated Vital Signs BP 104/56 (BP Location: Right Arm)   Pulse (!) 57   Temp 98.8 F (37.1 C) (Oral)   Resp 16   SpO2 95%   Physical Exam  Constitutional: She appears well-developed and well-nourished. No distress.  HENT:  Head: Normocephalic and atraumatic.  Eyes: Conjunctivae are normal. No scleral icterus.  Neck: Neck supple.  Cardiovascular: Normal rate, regular rhythm and normal heart sounds.   No murmur heard. Pulmonary/Chest: Effort normal and breath sounds normal. No respiratory distress. She has no wheezes. She has no rales.  Abdominal: Soft. There is tenderness (LLQ) in the left lower quadrant. There is no CVA tenderness (none on exam. Pt did receive fentanyl in triage).  Musculoskeletal: She exhibits no edema.  Neurological: She is alert.  Skin: Skin is warm and dry.  Psychiatric: She has a normal mood and affect.  Nursing note and vitals reviewed.    ED Treatments / Results  Labs (all labs ordered are listed, but only abnormal results are displayed) Labs Reviewed  BASIC METABOLIC PANEL - Abnormal; Notable for the following:       Result Value   Chloride 99 (*)    Glucose, Bld 160 (*)    Creatinine, Ser 1.21 (*)    Calcium 8.8 (*)    GFR calc non Af Amer 44 (*)    GFR calc Af Amer 51 (*)    All other components within normal limits  URINALYSIS, ROUTINE W REFLEX MICROSCOPIC - Abnormal; Notable for the following:    APPearance HAZY (*)    Hgb urine dipstick LARGE (*)    Protein, ur 30 (*)    Leukocytes, UA TRACE (*)    Bacteria, UA RARE (*)    Squamous Epithelial / LPF 0-5 (*)    All other components within normal limits  URINE  CULTURE  CBC    EKG  EKG Interpretation None       Radiology No results found.  Procedures Procedures (including critical care time)  Medications Ordered in ED Medications  ondansetron (ZOFRAN-ODT) disintegrating tablet 4 mg (4 mg Oral Given 03/12/16 1404)  fentaNYL (SUBLIMAZE) injection 50 mcg (50 mcg Nasal Given 03/12/16 1425)  sodium chloride 0.9 % bolus 1,000 mL (0 mLs Intravenous Stopped 03/12/16 1905)  HYDROcodone-acetaminophen (NORCO/VICODIN) 5-325 MG per tablet 1 tablet (1 tablet Oral Given 03/12/16 2131)  ondansetron (ZOFRAN-ODT) disintegrating tablet 4 mg (4 mg Oral Given 03/12/16 2130)     Initial Impression / Assessment and Plan / ED Course  I have reviewed the triage vital signs and the nursing notes.  Pertinent labs & imaging results that were available during my care of the patient were reviewed by  me and considered in my medical decision making (see chart for details).  Clinical Course    Patient is a 70 year old female who presents with continued and worsened left flank pain with nausea vomiting. Patient was seen here on 12/19 and was found have a 4 mm left ureteral stone. Patient's pain was controlled at that time and referred to urology. Patient was discharged with prescription for Norco.  Patient received fentanyl and Zofran in triage. On exam patient currently not having any CVA tenderness and nausea is controlled. Patient was given normal saline bolus due to decreased by mouth intake recently. Patient's creatinine did trend up from around 0.7 to 1.2. This is likely secondary to decreased by mouth intake from her nausea. Patient started unilateral so doubt tracheitis secondary to post renal etiology. Prior CT stone survey was reviewed and she had mild hydronephrosis but given the size of the kidney stone at 4 mm stone should pass on its own without any issue. UA obtained which shows trace leukocytes but no nitrite. Patient not having any UTI symptoms. Negative  for antibiotic at this time. Urine culture is ordered.  Patient states she has ran out of her Norco from her previous prescription. Patient given another prescription for Norco and she will follow-up with urology. If patient requires further pain control patient can follow-up with her PCP if she cannot be seen by urology in a timely fashion. Patient encouraged to maintain oral hydration. Return precautions were given. Pt d/c home in good condition. Pt passed PO challenge and symptoms controlled prior to discharge. Dose of norco and zofran given prior to dc home.   Pt seen with attending Dr. Oleta Mouse.    Final Clinical Impressions(s) / ED Diagnoses   Final diagnoses:  Flank pain  Nephrolithiasis    New Prescriptions New Prescriptions   No medications on file         Tobie Poet, DO 03/13/16 0143    Forde Dandy, MD 03/13/16 1144

## 2016-03-12 NOTE — ED Triage Notes (Signed)
Pt states "I came in Tuesday and they said it was a kidney stone with pain, I thought I was doing better, I was able to get the pain under control, but I woke up in cold sweats this morning, after I got up this morning the pain in my left side just kept getting worse.". Pt unsure if shes passed the stone or not.

## 2016-03-14 LAB — URINE CULTURE: Culture: NO GROWTH

## 2016-03-16 DIAGNOSIS — N201 Calculus of ureter: Secondary | ICD-10-CM | POA: Diagnosis not present

## 2016-04-12 ENCOUNTER — Other Ambulatory Visit: Payer: Self-pay | Admitting: Family Medicine

## 2016-04-12 DIAGNOSIS — Z1231 Encounter for screening mammogram for malignant neoplasm of breast: Secondary | ICD-10-CM

## 2016-05-12 DIAGNOSIS — G459 Transient cerebral ischemic attack, unspecified: Secondary | ICD-10-CM | POA: Diagnosis not present

## 2016-05-12 DIAGNOSIS — E559 Vitamin D deficiency, unspecified: Secondary | ICD-10-CM | POA: Diagnosis not present

## 2016-05-12 DIAGNOSIS — Z79899 Other long term (current) drug therapy: Secondary | ICD-10-CM | POA: Diagnosis not present

## 2016-05-12 DIAGNOSIS — E1149 Type 2 diabetes mellitus with other diabetic neurological complication: Secondary | ICD-10-CM | POA: Diagnosis not present

## 2016-05-12 DIAGNOSIS — N200 Calculus of kidney: Secondary | ICD-10-CM | POA: Diagnosis not present

## 2016-05-12 DIAGNOSIS — Z6836 Body mass index (BMI) 36.0-36.9, adult: Secondary | ICD-10-CM | POA: Diagnosis not present

## 2016-05-12 DIAGNOSIS — I10 Essential (primary) hypertension: Secondary | ICD-10-CM | POA: Diagnosis not present

## 2016-05-12 DIAGNOSIS — Z0000 Encounter for general adult medical examination without abnormal findings: Secondary | ICD-10-CM | POA: Diagnosis not present

## 2016-05-12 DIAGNOSIS — E1140 Type 2 diabetes mellitus with diabetic neuropathy, unspecified: Secondary | ICD-10-CM | POA: Diagnosis not present

## 2016-05-13 ENCOUNTER — Encounter: Payer: Self-pay | Admitting: Cardiovascular Disease

## 2016-05-13 ENCOUNTER — Ambulatory Visit (INDEPENDENT_AMBULATORY_CARE_PROVIDER_SITE_OTHER): Payer: Medicare HMO | Admitting: Cardiovascular Disease

## 2016-05-13 VITALS — BP 126/82 | HR 70 | Ht 64.0 in | Wt 206.0 lb

## 2016-05-13 DIAGNOSIS — E7800 Pure hypercholesterolemia, unspecified: Secondary | ICD-10-CM

## 2016-05-13 DIAGNOSIS — I10 Essential (primary) hypertension: Secondary | ICD-10-CM | POA: Diagnosis not present

## 2016-05-13 DIAGNOSIS — R0789 Other chest pain: Secondary | ICD-10-CM | POA: Diagnosis not present

## 2016-05-13 NOTE — Progress Notes (Signed)
Cardiology Office Note   Date:  05/13/2016   ID:  Stephanie Luna, DOB 08-16-1945, MRN [id number]  PCP:  Stephanie Coma, MD  Cardiologist:   Stephanie Latch, MD   No chief complaint on file.   History of Present Illness: Stephanie Luna is a 71 y.o. female with stroke, diabetes, transient RBBB,  hypertension, and hyperlipidemia who presents for follow up.  Stephanie Luna first established care 10/2015.  She had a LHC in 2012 that was reportedly negative for obstructive coronary disease.  She notes occasional episodes of chest pain that she attributes to her hiatal hernia. Last week she had an episode of chest pain that occurred after eating. It lasted for 10-15 minutes. She noted that when she stood up it improves. There is no associated shortness of breath, nausea, or diaphoresis.  She does not get much formal exercise but she continues to walk her dog 2-3 times each day. She walks for 3 or 4 blocks and denies any chest shortness of breath with this activity. She does note some mild swelling in her right ankle that she attributes to prior surgery and tendon rupture. She denies orthopnea or PND. She looks forward to the spring so that she can start back working in her yard.    Past Medical History:  Diagnosis Date  . CVA (cerebral vascular accident) (Stephanie Luna) 2015   L parietal lobe, other ischemic infarcts seen, no occlusive vascular disease mentioned  . Diabetes mellitus without complication (Stephanie Luna)   . Hypertension   . Kidney stones     Past Surgical History:  Procedure Laterality Date  . ABDOMINAL HYSTERECTOMY    . Colocystectomy     . MENISECTOMY    . TONSILLECTOMY       Current Outpatient Prescriptions  Medication Sig Dispense Refill  . acetaminophen (TYLENOL) 500 MG tablet Takes 2 tablets by mouth as needed for pain    . Calcium Citrate-Vitamin D (CALCIUM CITRATE + D) 315-250 MG-UNIT TABS takes 2 tabs by mouth once daily in evenings    . Cholecalciferol (VITAMIN D3 PO) Take 1  tablet by mouth every evening.    . clopidogrel (PLAVIX) 75 MG tablet takes 1 tablet by mouth once daily    . losartan (COZAAR) 50 MG tablet TAKE 1/2 TABLET DAILY    . metoprolol succinate (TOPROL-XL) 50 MG 24 hr tablet TAKE 1 AND 1/2 TABLETS (75mg ) EVERY DAY    . omeprazole (PRILOSEC) 40 MG capsule takes 40mg  by mouth once daily    . simvastatin (ZOCOR) 40 MG tablet takes 1 tablet by mouth once daily in evenings     No current facility-administered medications for this visit.     Allergies:   Metformin and related and Codeine    Social History:  The patient  reports that she has quit smoking. She has never used smokeless tobacco. She reports that she does not drink alcohol or use drugs.   Family History:  The patient's family history includes Colon cancer in her father.    ROS:  Please see the history of present illness.   Otherwise, review of systems are positive for none.   All other systems are reviewed and negative.    PHYSICAL EXAM: VS:  BP 126/82 (BP Location: Right Arm, Cuff Size: Large)   Pulse 70   Ht 5\' 4"  (1.626 m)   Wt 93.4 kg (206 lb)   BMI 35.36 kg/m  , BMI Body mass index is 35.36 kg/m. GENERAL:  Well appearing HEENT:  Pupils equal round and reactive, fundi not visualized, oral mucosa unremarkable NECK:  No jugular venous distention, waveform within normal limits, carotid upstroke brisk and symmetric, no bruits LYMPHATICS:  No cervical adenopathy LUNGS:  Clear to auscultation bilaterally HEART:  RRR.  PMI not displaced or sustained,S1 and S2 within normal limits, no S3, no S4, no clicks, no rubs, no murmurs ABD:  Flat, positive bowel sounds normal in frequency in pitch, no bruits, no rebound, no guarding, no midline pulsatile mass, no hepatomegaly, no splenomegaly EXT:  2 plus pulses throughout, no edema, no cyanosis no clubbing SKIN:  No rashes no nodules NEURO:  Cranial nerves II through XII grossly intact, motor grossly intact throughout PSYCH:  Cognitively  intact, oriented to person place and time   EKG:  EKG is ordered today. 05/13/16: sinus rhythm.  Rte 70 bpm.  Low voltage precordial leads and limb leads. 12/06/2013: Sinus rhythm. Rate 90 bpm. Right bundle branch block  Echo 08/29/13: LVEF greater than 70%. Mild LVH. Diastolic dysfunction   Recent Labs: 03/09/2016: ALT 16 03/12/2016: BUN 20; Creatinine, Ser 1.21; Hemoglobin 13.7; Platelets 240; Potassium 4.1; Sodium 136   7/17: Hemoglobin A1c 7.5%  04/03/15: WBC 6.6, hemoglobin 13.9, hematocrit 42.1, platelets 253 Sodium 139, potassium 4.4, BUN 15, creatinine 0.62 AST 15, ALT 12 TSH 1.44 Total cholesterol 147, triglycerides 175, HDL 46, LDL 66  Lipid Panel No results found for: CHOL, TRIG, HDL, CHOLHDL, VLDL, LDLCALC, LDLDIRECT    Wt Readings from Last 3 Encounters:  05/13/16 93.4 kg (206 lb)  03/09/16 90.7 kg (200 lb)  11/12/15 96.1 kg (211 lb 12.8 oz)      ASSESSMENT AND PLAN:  # Atypical chest pain: [name] reports a recent episode of atypical chest pain that does not seem like ischemia. It is likely to be able to her hiatal hernia. She has nonobstructive coronary disease. Continue Plavix and metoprolol.  # RBBB: Stable since 2012. Today she does not have a right bundle branch block on EKG.   # Hypertension: Blood pressure is well-controlled on losartan and metoprolol.  However, she has not yet taken her medication and her blood pressure is normal. She does report some episodes of lightheadedness and dizziness at home. We will reduce losartan to 25 mg daily. She will keep a log of her blood pressures at home. She will follow-up with our pharmacist in 2 weeks   # Hyperlipidemia:  LDL 66 03/2015.  Continue simvastatin.  Lipids were checked with her PCP yesterday. She will forward these results to [place].  # Stroke: Continue clopidogrel.  LDL is well-controlled.   Current medicines are reviewed at length with the patient today.  The patient does not have concerns  regarding medicines.  The following changes have been made:  no change  Labs/ tests ordered today include:  No orders of the defined types were placed in this encounter.    Disposition:   FU with Stephanie Norment C. Oval Linsey, MD, [facility] in 6 months.    This note was written with the assistance of speech recognition software.  Please excuse any transcriptional errors.  Signed, Mercadez Heitman C. Oval Linsey, MD, [facility]  05/13/2016 9:59 AM    [facility]

## 2016-05-13 NOTE — Patient Instructions (Signed)
Medication Instructions:  DECREASE YOUR LOSARTAN TO 25 MG DAILY   Labwork: NONE  Testing/Procedures: NONE  Follow-Up: Your physician recommends that you schedule a follow-up appointment in: 2 WEEKS WITH PHARM D FOR BLOOD PRESSURE  Your physician wants you to follow-up in: [place] will receive a reminder letter in the mail two months in advance. If you don't receive a letter, please call our office to schedule the follow-up appointment.  If you need a refill on your cardiac medications before your next appointment, please call your pharmacy.

## 2016-05-13 NOTE — Addendum Note (Signed)
Addended by: Alvina Filbert B on: 05/13/2016 10:00 AM   Modules accepted: Orders

## 2016-05-17 ENCOUNTER — Ambulatory Visit
Admission: RE | Admit: 2016-05-17 | Discharge: 2016-05-17 | Disposition: A | Discharge status: Home / Self Care | Payer: Medicare HMO | Source: Ambulatory Visit | Attending: Family Medicine | Admitting: Family Medicine

## 2016-05-17 DIAGNOSIS — Z1231 Encounter for screening mammogram for malignant neoplasm of breast: Secondary | ICD-10-CM | POA: Diagnosis not present

## 2016-05-19 DIAGNOSIS — E875 Hyperkalemia: Secondary | ICD-10-CM | POA: Diagnosis not present

## 2016-05-27 ENCOUNTER — Ambulatory Visit (INDEPENDENT_AMBULATORY_CARE_PROVIDER_SITE_OTHER): Payer: Medicare HMO | Admitting: Pharmacist

## 2016-05-27 VITALS — BP 118/78 | HR 65

## 2016-05-27 DIAGNOSIS — I10 Essential (primary) hypertension: Secondary | ICD-10-CM

## 2016-05-27 NOTE — Patient Instructions (Signed)
Return for a  follow up appointment in 1 month  Your blood pressure today is 118/78 pulse 65  Check your blood pressure at home daily (if able) and keep record of the readings.  Take your BP meds as follows: Continue ALL medication as prescibed  Bring all of your meds, your BP cuff and your record of home blood pressures to your next appointment.  Exercise as you're able, try to walk approximately 30 minutes per day.  Keep salt intake to a minimum, especially watch canned and prepared boxed foods.  Eat more fresh fruits and vegetables and fewer canned items.  Avoid eating in fast food restaurants.    HOW TO TAKE YOUR BLOOD PRESSURE: . Rest 5 minutes before taking your blood pressure. .  Don't smoke or drink caffeinated beverages for at least 30 minutes before. . Take your blood pressure before (not after) you eat. . Sit comfortably with your back supported and both feet on the floor (don't cross your legs). . Elevate your arm to heart level on a table or a desk. . Use the proper sized cuff. It should fit smoothly and snugly around your bare upper arm. There should be enough room to slip a fingertip under the cuff. The bottom edge of the cuff should be 1 inch above the crease of the elbow. . Ideally, take 3 measurements at one sitting and record the average.

## 2016-05-27 NOTE — Progress Notes (Signed)
Patient ID: Stephanie Luna                 DOB: 1945/10/01                      MRN: [id number]     HPI: Stephanie Luna is a 71 y.o. female referred by Dr. Oval Linsey to HTN clinic. PMH includes atypical chest pain, stroke, diabetes, transient RBBB, hypertension, and hyperlipidemia.  Blood pressure at goal during last office visit prior to taking BP medication. Patient reported episodes of lightheadedness and dizziness at home and losartan dose was decreased from 50mg  daily to 25mg  daily.  Patient presents today for HTN follow up. Denies headaches, fatigue, chest pain or swelling.  No "dizzy spells" recently. Major complain related to ankle and knee pain.   Current HTN meds:  Losartan 25mg  daily Metoprolol succinate 75mg  daily  BP goal:  <130/80  Social History: The patient  reports that she has quit smoking. She has never used smokeless tobacco. She reports that she does not drink alcohol or use drugs.  Diet: "keeps low sodium and low fat intake but nothing too extreme"  Exercise:  She does not get much formal exercise but she continues to walk her dog 2-3 times each day. She walks for 3 or 4 blocks and denies any chest shortness of breath with this activity.  Home BP readings: 21 readings; 127/81 average; range 111-162/73-103 **An isolated reading of 162/101 noted after day working at home and increase pain**  Wt Readings from Last 3 Encounters:  05/13/16 206 lb (93.4 kg)  03/09/16 200 lb (90.7 kg)  11/12/15 211 lb 12.8 oz (96.1 kg)   BP Readings from Last 3 Encounters:  05/27/16 118/78  05/13/16 126/82  03/12/16 109/65   Pulse Readings from Last 3 Encounters:  05/27/16 65  05/13/16 70  03/12/16 92    Past Medical History:  Diagnosis Date  . CVA (cerebral vascular accident) ([place]) 2015   L parietal lobe, other ischemic infarcts seen, no occlusive vascular disease mentioned  . Diabetes mellitus without complication ([place])   . Hypertension   . Kidney stones     Current  Outpatient Prescriptions on File Prior to Visit  Medication Sig Dispense Refill  . acetaminophen (TYLENOL) 500 MG tablet Takes 2 tablets by mouth as needed for pain    . Calcium Citrate-Vitamin D (CALCIUM CITRATE + D) 315-250 MG-UNIT TABS takes 2 tabs by mouth once daily in evenings    . Cholecalciferol (VITAMIN D3 PO) Take 1 tablet by mouth every evening.    . clopidogrel (PLAVIX) 75 MG tablet takes 1 tablet by mouth once daily    . losartan (COZAAR) 50 MG tablet TAKE 1/2 TABLET DAILY    . metoprolol succinate (TOPROL-XL) 50 MG 24 hr tablet TAKE 1 AND 1/2 TABLETS (75mg ) EVERY DAY    . omeprazole (PRILOSEC) 40 MG capsule takes 40mg  by mouth once daily    . simvastatin (ZOCOR) 40 MG tablet takes 1 tablet by mouth once daily in evenings     No current facility-administered medications on file prior to visit.     Allergies  Allergen Reactions  . Metformin And Related Other (See Comments)    Chest pain  . Codeine Nausea Only    Blood pressure 118/78, pulse 65, SpO2 96 %.  Hypertension:  Blood pressure at goal today and no dizziness noted. Patient continues to take medication mid to late morning. Home BP readings also at goal except  for 1 elevated number at 160/110 associated with acute pain.  Will continue current therapy without change and plan follow up in 1 month. Patient to keep record of home BP readings and to bring home monitor to next appointment to calibrate.  Stephanie Luna PharmD, [place] [address] 05/27/2016 10:29 AM

## 2016-06-03 DIAGNOSIS — Z961 Presence of intraocular lens: Secondary | ICD-10-CM | POA: Diagnosis not present

## 2016-06-03 DIAGNOSIS — H04123 Dry eye syndrome of bilateral lacrimal glands: Secondary | ICD-10-CM | POA: Diagnosis not present

## 2016-06-23 DIAGNOSIS — E2839 Other primary ovarian failure: Secondary | ICD-10-CM | POA: Diagnosis not present

## 2016-06-23 DIAGNOSIS — M8588 Other specified disorders of bone density and structure, other site: Secondary | ICD-10-CM | POA: Diagnosis not present

## 2016-06-24 ENCOUNTER — Encounter: Payer: Self-pay | Admitting: Pharmacist Clinician (PhC)/ Clinical Pharmacy Specialist

## 2016-06-24 ENCOUNTER — Ambulatory Visit (INDEPENDENT_AMBULATORY_CARE_PROVIDER_SITE_OTHER): Payer: Medicare HMO | Admitting: Pharmacist Clinician (PhC)/ Clinical Pharmacy Specialist

## 2016-06-24 DIAGNOSIS — I10 Essential (primary) hypertension: Secondary | ICD-10-CM

## 2016-06-24 NOTE — Progress Notes (Signed)
Patient ID: Stephanie Luna                 DOB: Aug 01, 1945                      MRN: [id number]     HPI: Stephanie Luna is a 71 y.o. female referred by Dr. Oval Linsey to HTN clinic. Today she is here for a follow up appointment.  At her last visit her BP was doing well, although she was complained of occasional dizzy spells.  These are less frequent now that she takes only 25 mg of losartan.   No changes were made to her medications, and she was to return to day to be sure readings were well controlled and not related to dizziness.  Since that visit, she only had one episode of dizziness, actually on the day after her appointment.  BP that day was good at 125/72.  She did have 1 reading since then that was 98/73, but she recalls feeling normal and was surprised at the reading.    PMH includes atypical chest pain, stroke, diabetes, transient RBBB, hypertension, and hyperlipidemia.  Blood pressure at goal during last office visit prior to taking BP medication.  No chest pain recently, no LEE, some DOE.     Current HTN meds:  Losartan 25mg  daily (am) Metoprolol succinate 75mg  daily (pm)  BP goal:  <130/80  Social History: The patient  reports that she has quit smoking. She has never used smokeless tobacco. She reports that she does not drink alcohol or use drugs.  Diet: keeps low sodium and low fat intake, doesn't watch too closely, but also avoids fried and salty foods  Exercise:  She does not get much formal exercise but she continues to walk her dog 2-3 times each day. She walks for 3 or 4 blocks and denies any chest shortness of breath with this activity.  Home BP readings: 42 readings; 123/61 average; range 98-137/66-85 - much improved from last visit   Wt Readings from Last 3 Encounters:  05/13/16 206 lb (93.4 kg)  03/09/16 200 lb (90.7 kg)  11/12/15 211 lb 12.8 oz (96.1 kg)   BP Readings from Last 3 Encounters:  06/24/16 122/72  05/27/16 118/78  05/13/16 126/82   Pulse Readings  from Last 3 Encounters:  06/24/16 64  05/27/16 65  05/13/16 70    Past Medical History:  Diagnosis Date  . CVA (cerebral vascular accident) ([place]) 2015   L parietal lobe, other ischemic infarcts seen, no occlusive vascular disease mentioned  . Diabetes mellitus without complication ([place])   . Hypertension   . Kidney stones     Current Outpatient Prescriptions on File Prior to Visit  Medication Sig Dispense Refill  . acetaminophen (TYLENOL) 500 MG tablet Takes 2 tablets by mouth as needed for pain    . Calcium Citrate-Vitamin D (CALCIUM CITRATE + D) 315-250 MG-UNIT TABS takes 2 tabs by mouth once daily in evenings    . Cholecalciferol (VITAMIN D3 PO) Take 1 tablet by mouth every evening.    . clopidogrel (PLAVIX) 75 MG tablet takes 1 tablet by mouth once daily    . losartan (COZAAR) 50 MG tablet TAKE 1/2 TABLET DAILY    . metoprolol succinate (TOPROL-XL) 50 MG 24 hr tablet TAKE 1 AND 1/2 TABLETS (75mg ) EVERY DAY    . omeprazole (PRILOSEC) 40 MG capsule takes 40mg  by mouth once daily    . simvastatin (ZOCOR) 40 MG tablet takes 1  tablet by mouth once daily in evenings     No current facility-administered medications on file prior to visit.     Allergies  Allergen Reactions  . Metformin And Related Other (See Comments)    Chest pain  . Codeine Nausea Only    Blood pressure 122/72, pulse 64.  Hypertension: Blood pressure good in office and much improved at home.  Patient is to continue with current regimen and recording of regular home BP readings.  She should call the office should she note that more than 1/3 of her readings are > 140/90.    Tommy Medal PharmD, CPP [facility] [address] [place] [address] 06/25/2016 7:32 AM

## 2016-06-24 NOTE — Patient Instructions (Addendum)
Call if you notice an increasing trend in your blood pressure, to the point where about 1/3 or more of the readings are > 140/90  (Heavin Sebree/Raquel [phone])  Your blood pressure today is 122/72  (Goal is < 130/80)  Check your blood pressure at home several times each week and keep record of the readings.  Take your BP meds as follows:  Continue with your current medications  Bring all of your meds, your BP cuff and your record of home blood pressures to your next appointment.  Exercise as you're able, try to walk approximately 30 minutes per day.  Keep salt intake to a minimum, especially watch canned and prepared boxed foods.  Eat more fresh fruits and vegetables and fewer canned items.  Avoid eating in fast food restaurants.    HOW TO TAKE YOUR BLOOD PRESSURE: . Rest 5 minutes before taking your blood pressure. .  Don't smoke or drink caffeinated beverages for at least 30 minutes before. . Take your blood pressure before (not after) you eat. . Sit comfortably with your back supported and both feet on the floor (don't cross your legs). . Elevate your arm to heart level on a table or a desk. . Use the proper sized cuff. It should fit smoothly and snugly around your bare upper arm. There should be enough room to slip a fingertip under the cuff. The bottom edge of the cuff should be 1 inch above the crease of the elbow. . Ideally, take 3 measurements at one sitting and record the average.

## 2016-06-24 NOTE — Assessment & Plan Note (Signed)
Blood pressure good in office and much improved at home.  Patient is to continue with current regimen and recording of regular home BP readings.  She should call the office should she note that more than 1/3 of her readings are > 140/90.

## 2016-12-14 DIAGNOSIS — Z23 Encounter for immunization: Secondary | ICD-10-CM | POA: Diagnosis not present

## 2017-04-27 ENCOUNTER — Other Ambulatory Visit: Payer: Self-pay | Admitting: Family Medicine

## 2017-04-27 DIAGNOSIS — Z1231 Encounter for screening mammogram for malignant neoplasm of breast: Secondary | ICD-10-CM

## 2017-05-14 DIAGNOSIS — R69 Illness, unspecified: Secondary | ICD-10-CM | POA: Diagnosis not present

## 2017-05-24 ENCOUNTER — Ambulatory Visit
Admission: RE | Admit: 2017-05-24 | Discharge: 2017-05-24 | Disposition: A | Discharge status: Home / Self Care | Payer: Medicare HMO | Source: Ambulatory Visit | Attending: Family Medicine | Admitting: Family Medicine

## 2017-05-24 DIAGNOSIS — Z1231 Encounter for screening mammogram for malignant neoplasm of breast: Secondary | ICD-10-CM | POA: Diagnosis not present

## 2017-06-03 DIAGNOSIS — H04123 Dry eye syndrome of bilateral lacrimal glands: Secondary | ICD-10-CM | POA: Diagnosis not present

## 2017-06-03 DIAGNOSIS — Z961 Presence of intraocular lens: Secondary | ICD-10-CM | POA: Diagnosis not present

## 2017-06-03 DIAGNOSIS — E119 Type 2 diabetes mellitus without complications: Secondary | ICD-10-CM | POA: Diagnosis not present

## 2017-06-16 DIAGNOSIS — Z0000 Encounter for general adult medical examination without abnormal findings: Secondary | ICD-10-CM | POA: Diagnosis not present

## 2017-06-16 DIAGNOSIS — Z8673 Personal history of transient ischemic attack (TIA), and cerebral infarction without residual deficits: Secondary | ICD-10-CM | POA: Diagnosis not present

## 2017-06-16 DIAGNOSIS — E559 Vitamin D deficiency, unspecified: Secondary | ICD-10-CM | POA: Diagnosis not present

## 2017-06-16 DIAGNOSIS — Z79899 Other long term (current) drug therapy: Secondary | ICD-10-CM | POA: Diagnosis not present

## 2017-06-16 DIAGNOSIS — I10 Essential (primary) hypertension: Secondary | ICD-10-CM | POA: Diagnosis not present

## 2017-06-16 DIAGNOSIS — Z1159 Encounter for screening for other viral diseases: Secondary | ICD-10-CM | POA: Diagnosis not present

## 2017-06-16 DIAGNOSIS — E1169 Type 2 diabetes mellitus with other specified complication: Secondary | ICD-10-CM | POA: Diagnosis not present

## 2017-07-02 DIAGNOSIS — B349 Viral infection, unspecified: Secondary | ICD-10-CM | POA: Diagnosis not present

## 2017-07-02 DIAGNOSIS — J0100 Acute maxillary sinusitis, unspecified: Secondary | ICD-10-CM | POA: Diagnosis not present

## 2017-09-12 DIAGNOSIS — M25562 Pain in left knee: Secondary | ICD-10-CM | POA: Diagnosis not present

## 2017-09-12 DIAGNOSIS — M1712 Unilateral primary osteoarthritis, left knee: Secondary | ICD-10-CM | POA: Insufficient documentation

## 2017-09-13 ENCOUNTER — Telehealth: Payer: Self-pay

## 2017-09-13 NOTE — Telephone Encounter (Addendum)
   Primary [place], MD  Chart reviewed as part of pre-operative protocol coverage. Because of Stephanie Luna's past medical history and time since last visit, he/she will require a follow-up visit in order to better assess preoperative cardiovascular risk. We cannot provide remote pre-op clearance as last visit with cardiologist was in 04/2016 (>1 year ago).  Pre-op covering staff: - Please schedule appointment and call patient to inform them. - Please contact requesting surgeon's office via preferred method (i.e, phone, fax) to inform them of need for appointment prior to surgery.  The preop intake also includes request to hold Coumadin. Our last records do not indicate she is on Coumadin nor do we follow this, so please make surgeon and patient aware of this as well.  Charlie Pitter, PA-C  09/13/2017, 4:55 PM

## 2017-09-13 NOTE — Telephone Encounter (Signed)
   [facility] Pre-operative Risk Assessment    Request for surgical clearance:  1. What type of surgery is being performed? LEFT TOTAL KNEE ARTHROPLASTY  2. When is this surgery scheduled? 10/11/17  3. What type of clearance is required (medical clearance vs. Pharmacy clearance to hold med vs. Both)? PHARMACY CLEARANCE TO HOLD MED   4. Are there any medications that need to be held prior to surgery and how long? COUMADIN  5. Practice name and name of physician performing surgery? [facility] - DR. OLIN  6. What is your office phone number [phone]   7.   What is your office fax number [phone] - [place]  8.   Anesthesia type (None, local, MAC, general) ? SPINAL   Jacinta Shoe 09/13/2017, 11:45 AM  _________________________________________________________________   (provider comments below)

## 2017-09-14 NOTE — Telephone Encounter (Signed)
Pt has been scheduled to see Kerin Ransom, PA-C 09/30/17.

## 2017-09-17 DIAGNOSIS — M25571 Pain in right ankle and joints of right foot: Secondary | ICD-10-CM | POA: Diagnosis not present

## 2017-09-30 ENCOUNTER — Telehealth: Payer: Self-pay | Admitting: Cardiology

## 2017-09-30 ENCOUNTER — Ambulatory Visit: Payer: Medicare HMO | Admitting: Cardiology

## 2017-09-30 ENCOUNTER — Encounter: Payer: Self-pay | Admitting: Cardiology

## 2017-09-30 VITALS — BP 126/74 | HR 73 | Ht 64.0 in | Wt 209.0 lb

## 2017-09-30 DIAGNOSIS — I4510 Unspecified right bundle-branch block: Secondary | ICD-10-CM

## 2017-09-30 DIAGNOSIS — I10 Essential (primary) hypertension: Secondary | ICD-10-CM | POA: Diagnosis not present

## 2017-09-30 DIAGNOSIS — Z01810 Encounter for preprocedural cardiovascular examination: Secondary | ICD-10-CM | POA: Diagnosis not present

## 2017-09-30 DIAGNOSIS — Z8673 Personal history of transient ischemic attack (TIA), and cerebral infarction without residual deficits: Secondary | ICD-10-CM | POA: Diagnosis not present

## 2017-09-30 DIAGNOSIS — E669 Obesity, unspecified: Secondary | ICD-10-CM | POA: Diagnosis not present

## 2017-09-30 DIAGNOSIS — Z01818 Encounter for other preprocedural examination: Secondary | ICD-10-CM

## 2017-09-30 NOTE — H&P (Signed)
TOTAL KNEE ADMISSION H&P  Patient is being admitted for left total knee arthroplasty.  Subjective:  Chief Complaint:   Left knee primary OA / pain  HPI: Stephanie Luna, 72 y.o. female, has a history of pain and functional disability in the left knee due to arthritis and has failed non-surgical conservative treatments for greater than 12 weeks to include NSAID's and/or analgesics, corticosteriod injections, use of assistive devices and activity modification.  Onset of symptoms was gradual, starting >10 years ago with gradually worsening course since that time. The patient noted prior procedures on the knee to include  arthroscopy and menisectomy on the left knee(s).  Patient currently rates pain in the left knee(s) at 10 out of 10 with activity. Patient has night pain, worsening of pain with activity and weight bearing, pain that interferes with activities of daily living, pain with passive range of motion, crepitus and joint swelling.  Patient has evidence of periarticular osteophytes and joint space narrowing by imaging studies. There is no active infection.  Risks, benefits and expectations were discussed with the patient.  Risks including but not limited to the risk of anesthesia, blood clots, nerve damage, blood vessel damage, failure of the prosthesis, infection and up to and including death.  Patient understand the risks, benefits and expectations and wishes to proceed with surgery.   PCP: Jonathon Jordan, MD  D/C Plans:       Home  Post-op Meds:       No Rx given  Tranexamic Acid:      To be given - IV   Decadron:      Is to be given  FYI:     Plavix & ASA  Norco (try 1st if nausea will have to change)  DME:   Pt already has equipment  PT:   OPPT Rx given   Patient Active Problem List   Diagnosis Date Noted  . Encounter for pre-operative cardiovascular clearance 09/30/2017  . History of stroke 09/30/2017  . Neurologic disorder associated with diabetes mellitus ([place]) 11/11/2015   . Essential hypertension 11/11/2015  . Obesity (BMI 30-39.9) 11/11/2015  . Bundle branch block, right 11/11/2015  . Temporary cerebral vascular dysfunction 11/11/2015  . Controlled type 2 diabetes mellitus without complication ([place]) 01/05/5101   Past Medical History:  Diagnosis Date  . CVA (cerebral vascular accident) ([place]) 2015   L parietal lobe, other ischemic infarcts seen, no occlusive vascular disease mentioned  . Diabetes mellitus without complication ([place])   . Hypertension   . Kidney stones     Past Surgical History:  Procedure Laterality Date  . ABDOMINAL HYSTERECTOMY    . Colocystectomy     . MENISECTOMY    . TONSILLECTOMY      No current facility-administered medications for this encounter.    Current Outpatient Medications  Medication Sig Dispense Refill Last Dose  . acetaminophen (TYLENOL) 500 MG tablet Take 1000 mg by mouth 3 times daily   Taking  . Calcium Citrate-Vitamin D (CALCIUM CITRATE + D) 315-250 MG-UNIT TABS Take 2 tablets by mouth once daily in the evening   Taking  . cholecalciferol (VITAMIN D) 1000 units tablet Take 1,000 Units by mouth every evening.   Taking  . clopidogrel (PLAVIX) 75 MG tablet Take 75 mg by mouth once daily   Taking  . losartan (COZAAR) 50 MG tablet Take 25 mg by mouth once daily   Taking  . metoprolol succinate (TOPROL-XL) 50 MG 24 hr tablet Take 75mg  by mouth once daily in  the eveing   Taking  . omeprazole (PRILOSEC) 40 MG capsule Take 40 mg by mouth once daily   Taking  . simvastatin (ZOCOR) 40 MG tablet Take 40 mg by mouth once daily in the evening   Taking   Allergies  Allergen Reactions  . Metformin And Related Other (See Comments)    Chest pain  . Codeine Nausea Only    Social History   Tobacco Use  . Smoking status: Former Research scientist (life sciences)  . Smokeless tobacco: Never Used  Substance Use Topics  . Alcohol use: No    Family History  Problem Relation Age of Onset  . Colon cancer Father   . Breast cancer Sister         diagnosed in her 14"s     Review of Systems  Constitutional: Negative.   HENT: Positive for hearing loss and tinnitus.   Eyes: Negative.   Respiratory: Negative.   Cardiovascular: Negative.   Gastrointestinal: Positive for heartburn.  Genitourinary: Positive for frequency.  Musculoskeletal: Positive for back pain and joint pain.  Skin: Negative.   Neurological: Negative.   Endo/Heme/Allergies: Positive for environmental allergies.  Psychiatric/Behavioral: Negative.     Objective:  Physical Exam  Constitutional: She is oriented to person, place, and time. She appears well-developed.  HENT:  Head: Normocephalic.  Eyes: Pupils are equal, round, and reactive to light.  Neck: Neck supple. No JVD present. No tracheal deviation present. No thyromegaly present.  Cardiovascular: Normal rate, regular rhythm and intact distal pulses.  Respiratory: Effort normal and breath sounds normal. No respiratory distress. She has no wheezes.  GI: Soft. There is no tenderness. There is no guarding.  Musculoskeletal:       Left knee: She exhibits decreased range of motion, swelling and bony tenderness. She exhibits no ecchymosis, no deformity, no laceration and no erythema. Tenderness found.  Lymphadenopathy:    She has no cervical adenopathy.  Neurological: She is alert and oriented to person, place, and time.  Skin: Skin is warm and dry.  Psychiatric: She has a normal mood and affect.    Vital signs in last 24 hours: Pulse Rate:  [73] 73 (07/12 0802) BP: (126)/(74) 126/74 (07/12 0802) SpO2:  [99 %] 99 % (07/12 0802) Weight:  [94.8 kg (209 lb)] 94.8 kg (209 lb) (07/12 0802)  Labs:   Estimated body mass index is 35.87 kg/m as calculated from the following:   Height as of 09/30/17: 5\' 4"  (1.626 m).   Weight as of 09/30/17: 94.8 kg (209 lb).   Imaging Review Plain radiographs demonstrate severe degenerative joint disease of the left knee(s).  The bone quality appears to be good for age and  reported activity level.   Preoperative templating of the joint replacement has been completed, documented, and submitted to the Operating Room personnel in order to optimize intra-operative equipment management.    Patient's anticipated LOS is less than 2 midnights, meeting these requirements: - Younger than 68 - Lives within 1 hour of care - Has a competent adult at home to recover with post-op recover - NO history of  - Chronic pain requiring opiods  - Coronary Artery Disease  - Heart failure  - Heart attack  - DVT/VTE  - Respiratory Failure/COPD  - Renal failure  - Anemia  - Advanced Liver disease        Assessment/Plan:  End stage arthritis, left knee   The patient history, physical examination, clinical judgment of the provider and imaging studies are consistent with end  stage degenerative joint disease of the left knee(s) and total knee arthroplasty is deemed medically necessary. The treatment options including medical management, injection therapy arthroscopy and arthroplasty were discussed at length. The risks and benefits of total knee arthroplasty were presented and reviewed. The risks due to aseptic loosening, infection, stiffness, patella tracking problems, thromboembolic complications and other imponderables were discussed. The patient acknowledged the explanation, agreed to proceed with the plan and consent was signed. Patient is being admitted for inpatient treatment for surgery, pain control, PT, OT, prophylactic antibiotics, VTE prophylaxis, progressive ambulation and ADL's and discharge planning. The patient is planning to be discharged home.     [place] Namir Neto   PA-C  09/30/2017, 10:30 AM

## 2017-09-30 NOTE — Progress Notes (Signed)
09/30/2017 Stephanie Luna   1945/12/11  [id number]  Primary Physician Jonathon Jordan, MD Primary Cardiologist: Dr Oval Linsey  HPI:  72 y/o female followed by Dr Oval Linsey with a history of stroke in 2015, HTN, HLD, and RBBB. The patient had a cath in 2012 that reportedly showed no significant CAD. She had a Myoview in Sept 2016 [facility] [place]) that was normal. In 2015 she had a Lt parietal CVA by MR (also in [place]). She was placed on Plavix after this. She established care here in 2017. Since 2017 she has not had chest pain or required any cardiac function studies. Her LOV with Dr Oval Linsey was 05/13/16. She is in the office today for pre op clearance- Lt TKR.  This is scheduled for 10/11/17 with dr Alvan Dame. The pt continues to do well from a cardiac standpoint. Her activity is limited secondary to her knee, but she does housework and walks her dog without chest pain or SOB.    Current Outpatient Medications  Medication Sig Dispense Refill  . acetaminophen (TYLENOL) 500 MG tablet Take 1000 mg by mouth 3 times daily    . Calcium Citrate-Vitamin D (CALCIUM CITRATE + D) 315-250 MG-UNIT TABS Take 2 tablets by mouth once daily in the evening    . cholecalciferol (VITAMIN D) 1000 units tablet Take 1,000 Units by mouth every evening.    . clopidogrel (PLAVIX) 75 MG tablet Take 75 mg by mouth once daily    . losartan (COZAAR) 50 MG tablet Take 25 mg by mouth once daily    . metoprolol succinate (TOPROL-XL) 50 MG 24 hr tablet Take 75mg  by mouth once daily in the eveing    . omeprazole (PRILOSEC) 40 MG capsule Take 40 mg by mouth once daily    . simvastatin (ZOCOR) 40 MG tablet Take 40 mg by mouth once daily in the evening     No current facility-administered medications for this visit.     Allergies  Allergen Reactions  . Metformin And Related Other (See Comments)    Chest pain  . Codeine Nausea Only    Past Medical History:  Diagnosis Date  . CVA (cerebral vascular accident) ([place]) 2015   L  parietal lobe, other ischemic infarcts seen, no occlusive vascular disease mentioned  . Diabetes mellitus without complication ([place])   . Hypertension   . Kidney stones     Social History   Socioeconomic History  . Marital status: Widowed    Spouse name: Not on file  . Number of children: Not on file  . Years of education: Not on file  . Highest education level: Not on file  Occupational History  . Not on file  Social Needs  . Financial resource strain: Not on file  . Food insecurity:    Worry: Not on file    Inability: Not on file  . Transportation needs:    Medical: Not on file    Non-medical: Not on file  Tobacco Use  . Smoking status: Former Research scientist (life sciences)  . Smokeless tobacco: Never Used  Substance and Sexual Activity  . Alcohol use: No  . Drug use: No  . Sexual activity: Not on file  Lifestyle  . Physical activity:    Days per week: Not on file    Minutes per session: Not on file  . Stress: Not on file  Relationships  . Social connections:    Talks on phone: Not on file    Gets together: Not on file  Attends religious service: Not on file    Active member of club or organization: Not on file    Attends meetings of clubs or organizations: Not on file    Relationship status: Not on file  . Intimate partner violence:    Fear of current or ex partner: Not on file    Emotionally abused: Not on file    Physically abused: Not on file    Forced sexual activity: Not on file  Other Topics Concern  . Not on file  Social History Narrative  . Not on file     Family History  Problem Relation Age of Onset  . Colon cancer Father   . Breast cancer Sister        diagnosed in her 28"s     Review of Systems: General: negative for chills, fever, night sweats or weight changes.  Cardiovascular: negative for chest pain, dyspnea on exertion, edema, orthopnea, palpitations, paroxysmal nocturnal dyspnea or shortness of breath Dermatological: negative for rash Respiratory:  negative for cough or wheezing Urologic: negative for hematuria Abdominal: negative for nausea, vomiting, diarrhea, bright red blood per rectum, melena, or hematemesis Neurologic: negative for visual changes, syncope, or dizziness All other systems reviewed and are otherwise negative except as noted above.    Blood pressure 126/74, pulse 73, height 5\' 4"  (1.626 m), weight 209 lb (94.8 kg), SpO2 99 %.  General appearance: alert, cooperative and no distress Neck: no carotid bruit and no JVD Lungs: clear to auscultation bilaterally Heart: regular rate and rhythm Extremities: obvious deformity Lt knee Skin: Skin color, texture, turgor normal. No rashes or lesions Neurologic: Grossly normal  EKG NSR, RBBB  ASSESSMENT AND PLAN:   Encounter for pre-operative cardiovascular clearance Pre op Lt TKR  Essential hypertension Controlled  Bundle branch block, right Chronic  Obesity (BMI 30-39.9) She has some daytime fatigue, frequently fall asleep at the computer, and snores. She is not interested in considering a sleep study at this time.   History of stroke H/O Lt parietal CVA 2015. At that time she was told there was an incidental finding of a prior asymptomatic, old stroke as well.   PLAN  She is stable from a cardiac standpoint and cleared for knee surgery without further cradia work up. I'll discuss holding Plavix with Dr Oval Linsey.   Kerin Ransom PA-C 09/30/2017 8:33 AM

## 2017-09-30 NOTE — Assessment & Plan Note (Signed)
H/O Lt parietal CVA 2015. At that time she was told there was an incidental finding of a prior asymptomatic, old stroke as well.

## 2017-09-30 NOTE — Progress Notes (Signed)
CARDIAC CLEARANCE TELE NOTE Epic 09-30-17  LOV CARDIOLOGY 09-30-17 Epic

## 2017-09-30 NOTE — Assessment & Plan Note (Signed)
She has some daytime fatigue, frequently fall asleep at the computer, and snores. She is not interested in considering a sleep study at this time.

## 2017-09-30 NOTE — Patient Instructions (Signed)
Medication Instructions:  Your physician recommends that you continue on your current medications as directed. Please refer to the Current Medication list given to you today.  Labwork: None   Testing/Procedures: None   Follow-Up: Your physician wants you to follow-up in: 6 months with Dr Oval Linsey. You will receive a reminder letter in the mail two months in advance. If you don't receive a letter, please call our office to schedule the follow-up appointment.  Any Other Special Instructions Will Be Listed Below (If Applicable). If you need a refill on your cardiac medications before your next appointment, please call your pharmacy.

## 2017-09-30 NOTE — Assessment & Plan Note (Signed)
Pre op Lt TKR

## 2017-09-30 NOTE — Telephone Encounter (Signed)
   Primary Cardiologist: Skeet Latch, MD  Chart reviewed and patient examined 09/30/17 as part of pre-operative protocol coverage. Given past medical history and time since last visit, based on ACC/AHA guidelines, Myrla Malanowski would be at acceptable risk for the planned procedure without further cardiovascular testing.   Dr Oval Linsey has cleared her to stop Plavix 5 days pre op, she would prefer the pt remain on ASA 81 mg if possible but will defer to Dr Alvan Dame.   I will route this recommendation to the requesting party via Epic fax function and remove from pre-op pool.  Please call with questions.  Kerin Ransom, PA-C 09/30/2017, 4:28 PM

## 2017-09-30 NOTE — Patient Instructions (Addendum)
Graciela Plato  09/30/2017   Your procedure is scheduled on: 10-11-17   Report to [facility] Main  Entrance    Report to admitting at 12:00PM    Call this number if you have problems the morning of surgery [phone]     Remember: Do not eat food After Midnight. YOU MAY HAVE CLEAR LIQUIDS FROM MIDNIGHT UNTIL 8AM. NOTHING AFTER 8AM!     Take these medicines the morning of surgery with A SIP OF WATER: TYLENOL IF NEEDED, OMEPRAZOLE                                 You may not have any metal on your body including hair pins and              piercings  Do not wear jewelry, make-up, lotions, powders or perfumes, deodorant             Do not wear nail polish.  Do not shave  48 hours prior to surgery.              Do not bring valuables to the hospital. [place].  Contacts, dentures or bridgework may not be worn into surgery.  Leave suitcase in the car. After surgery it may be brought to your room.                   Please read over the following fact sheets you were given: _____________________________________________________________________                CLEAR LIQUID DIET   Foods Allowed                                                                     Foods Excluded  Coffee and tea, regular and decaf                             liquids that you cannot  Plain Jell-O in any flavor                                             see through such as: Fruit ices (not with fruit pulp)                                     milk, soups, orange juice  Iced Popsicles                                    All solid food Carbonated beverages, regular and diet  Cranberry, grape and apple juices Sports drinks like Gatorade Lightly seasoned clear broth or consume(fat free) Sugar, honey syrup  Sample Menu Breakfast                                Lunch                                      Supper Cranberry juice                    Beef broth                            Chicken broth Jell-O                                     Grape juice                           Apple juice Coffee or tea                        Jell-O                                      Popsicle                                                Coffee or tea                        Coffee or tea  _____________________________________________________________________    How to Manage Your Diabetes Before and After Surgery  Why is it important to control my blood sugar before and after surgery? . Improving blood sugar levels before and after surgery helps healing and can limit problems. . A way of improving blood sugar control is eating a healthy diet by: o  Eating less sugar and carbohydrates o  Increasing activity/exercise o  Talking with your doctor about reaching your blood sugar goals . High blood sugars (greater than 180 mg/dL) can raise your risk of infections and slow your recovery, so you will need to focus on controlling your diabetes during the weeks before surgery. . Make sure that the doctor who takes care of your diabetes knows about your planned surgery including the date and location.   . If you are admitted to the hospital after surgery: o Your blood sugar will be checked by the staff and you will probably be given insulin after surgery (instead of oral diabetes medicines) to make sure you have good blood sugar levels. o The goal for blood sugar control after surgery is 80-180 mg/dL.    Patient Signature:  Date:   Nurse Signature:  Date:   Reviewed and Endorsed by [facility] Patient Education Committee, August 2015 [facility] - Preparing for Surgery Before surgery, you can play an important role.  Because skin is not sterile, your skin needs to be as free of germs  as possible.  You can reduce the number of germs on your skin by washing with CHG (chlorahexidine gluconate) soap before  surgery.  CHG is an antiseptic cleaner which kills germs and bonds with the skin to continue killing germs even after washing. Please DO NOT use if you have an allergy to CHG or antibacterial soaps.  If your skin becomes reddened/irritated stop using the CHG and inform your nurse when you arrive at Short Stay. Do not shave (including legs and underarms) for at least 48 hours prior to the first CHG shower.  You may shave your face/neck. Please follow these instructions carefully:  1.  Shower with CHG Soap the night before surgery and the  morning of Surgery.  2.  If you choose to wash your hair, wash your hair first as usual with your  normal  shampoo.  3.  After you shampoo, rinse your hair and body thoroughly to remove the  shampoo.                           4.  Use CHG as you would any other liquid soap.  You can apply chg directly  to the skin and wash                       Gently with a scrungie or clean washcloth.  5.  Apply the CHG Soap to your body ONLY FROM THE NECK DOWN.   Do not use on face/ open                           Wound or open sores. Avoid contact with eyes, ears mouth and genitals (private parts).                       Wash face,  Genitals (private parts) with your normal soap.             6.  Wash thoroughly, paying special attention to the area where your surgery  will be performed.  7.  Thoroughly rinse your body with warm water from the neck down.  8.  DO NOT shower/wash with your normal soap after using and rinsing off  the CHG Soap.                9.  Pat yourself dry with a clean towel.            10.  Wear clean pajamas.            11.  Place clean sheets on your bed the night of your first shower and do not  sleep with pets. Day of Surgery : Do not apply any lotions/deodorants the morning of surgery.  Please wear clean clothes to the hospital/surgery center.  FAILURE TO FOLLOW THESE INSTRUCTIONS MAY RESULT IN THE CANCELLATION OF YOUR SURGERY PATIENT  SIGNATURE_________________________________  NURSE SIGNATURE__________________________________  ________________________________________________________________________   Adam Phenix  An incentive spirometer is a tool that can help keep your lungs clear and active. This tool measures how well you are filling your lungs with each breath. Taking long deep breaths may help reverse or decrease the chance of developing breathing (pulmonary) problems (especially infection) following:  A long period of time when you are unable to move or be active. BEFORE THE PROCEDURE   If the spirometer includes an indicator to show your best effort, your nurse or  respiratory therapist will set it to a desired goal.  If possible, sit up straight or lean slightly forward. Try not to slouch.  Hold the incentive spirometer in an upright position. INSTRUCTIONS FOR USE  1. Sit on the edge of your bed if possible, or sit up as far as you can in bed or on a chair. 2. Hold the incentive spirometer in an upright position. 3. Breathe out normally. 4. Place the mouthpiece in your mouth and seal your lips tightly around it. 5. Breathe in slowly and as deeply as possible, raising the piston or the ball toward the top of the column. 6. Hold your breath for 3-5 seconds or for as long as possible. Allow the piston or ball to fall to the bottom of the column. 7. Remove the mouthpiece from your mouth and breathe out normally. 8. Rest for a few seconds and repeat Steps 1 through 7 at least 10 times every 1-2 hours when you are awake. Take your time and take a few normal breaths between deep breaths. 9. The spirometer may include an indicator to show your best effort. Use the indicator as a goal to work toward during each repetition. 10. After each set of 10 deep breaths, practice coughing to be sure your lungs are clear. If you have an incision (the cut made at the time of surgery), support your incision when coughing  by placing a pillow or rolled up towels firmly against it. Once you are able to get out of bed, walk around indoors and cough well. You may stop using the incentive spirometer when instructed by your caregiver.  RISKS AND COMPLICATIONS  Take your time so you do not get dizzy or light-headed.  If you are in pain, you may need to take or ask for pain medication before doing incentive spirometry. It is harder to take a deep breath if you are having pain. AFTER USE  Rest and breathe slowly and easily.  It can be helpful to keep track of a log of your progress. Your caregiver can provide you with a simple table to help with this. If you are using the spirometer at home, follow these instructions: [place] IF:   You are having difficultly using the spirometer.  You have trouble using the spirometer as often as instructed.  Your pain medication is not giving enough relief while using the spirometer.  You develop fever of 100.5 F (38.1 C) or higher. SEEK IMMEDIATE MEDICAL CARE IF:   You cough up bloody sputum that had not been present before.  You develop fever of 102 F (38.9 C) or greater.  You develop worsening pain at or near the incision site. MAKE SURE YOU:   Understand these instructions.  Will watch your condition.  Will get help right away if you are not doing well or get worse. Document Released: 07/19/2006 Document Revised: 05/31/2011 Document Reviewed: 09/19/2006 ExitCare Patient Information 2014 ExitCare, [place].   ________________________________________________________________________  WHAT IS A BLOOD TRANSFUSION? Blood Transfusion Information  A transfusion is the replacement of blood or some of its parts. Blood is made up of multiple cells which provide different functions.  Red blood cells carry oxygen and are used for blood loss replacement.  White blood cells fight against infection.  Platelets control bleeding.  Plasma helps clot  blood.  Other blood products are available for specialized needs, such as hemophilia or other clotting disorders. BEFORE THE TRANSFUSION  Who gives blood for transfusions?   Healthy volunteers  who are fully evaluated to make sure their blood is safe. This is blood bank blood. Transfusion therapy is the safest it has ever been in the practice of medicine. Before blood is taken from a donor, a complete history is taken to make sure that person has no history of diseases nor engages in risky social behavior (examples are intravenous drug use or sexual activity with multiple partners). The donor's travel history is screened to minimize risk of transmitting infections, such as malaria. The donated blood is tested for signs of infectious diseases, such as HIV and hepatitis. The blood is then tested to be sure it is compatible with you in order to minimize the chance of a transfusion reaction. If you or a relative donates blood, this is often done in anticipation of surgery and is not appropriate for emergency situations. It takes many days to process the donated blood. RISKS AND COMPLICATIONS Although transfusion therapy is very safe and saves many lives, the main dangers of transfusion include:   Getting an infectious disease.  Developing a transfusion reaction. This is an allergic reaction to something in the blood you were given. Every precaution is taken to prevent this. The decision to have a blood transfusion has been considered carefully by your caregiver before blood is given. Blood is not given unless the benefits outweigh the risks. AFTER THE TRANSFUSION  Right after receiving a blood transfusion, you will usually feel much better and more energetic. This is especially true if your red blood cells have gotten low (anemic). The transfusion raises the level of the red blood cells which carry oxygen, and this usually causes an energy increase.  The nurse administering the transfusion will monitor  you carefully for complications. HOME CARE INSTRUCTIONS  No special instructions are needed after a transfusion. You may find your energy is better. Speak with your caregiver about any limitations on activity for underlying diseases you may have. SEEK MEDICAL CARE IF:   Your condition is not improving after your transfusion.  You develop redness or irritation at the intravenous (IV) site. SEEK IMMEDIATE MEDICAL CARE IF:  Any of the following symptoms occur over the next 12 hours:  Shaking chills.  You have a temperature by mouth above 102 F (38.9 C), not controlled by medicine.  Chest, back, or muscle pain.  People around you feel you are not acting correctly or are confused.  Shortness of breath or difficulty breathing.  Dizziness and fainting.  You get a rash or develop hives.  You have a decrease in urine output.  Your urine turns a dark color or changes to pink, red, or brown. Any of the following symptoms occur over the next 10 days:  You have a temperature by mouth above 102 F (38.9 C), not controlled by medicine.  Shortness of breath.  Weakness after normal activity.  The white part of the eye turns yellow (jaundice).  You have a decrease in the amount of urine or are urinating less often.  Your urine turns a dark color or changes to pink, red, or brown. Document Released: 03/05/2000 Document Revised: 05/31/2011 Document Reviewed: 10/23/2007 [facility] Patient Information 2014 [place], [place].  _______________________________________________________________________

## 2017-09-30 NOTE — Assessment & Plan Note (Signed)
Chronic. 

## 2017-09-30 NOTE — Assessment & Plan Note (Signed)
Controlled.  

## 2017-10-03 ENCOUNTER — Other Ambulatory Visit: Payer: Self-pay

## 2017-10-03 ENCOUNTER — Encounter (HOSPITAL_COMMUNITY): Payer: Self-pay

## 2017-10-03 ENCOUNTER — Encounter (HOSPITAL_COMMUNITY)
Admission: RE | Admit: 2017-10-03 | Discharge: 2017-10-03 | Disposition: A | Discharge status: Home / Self Care | Payer: Medicare HMO | Source: Ambulatory Visit | Attending: Orthopedic Surgery | Admitting: Orthopedic Surgery

## 2017-10-03 DIAGNOSIS — M25562 Pain in left knee: Secondary | ICD-10-CM | POA: Diagnosis not present

## 2017-10-03 DIAGNOSIS — M1712 Unilateral primary osteoarthritis, left knee: Secondary | ICD-10-CM | POA: Insufficient documentation

## 2017-10-03 DIAGNOSIS — Z01812 Encounter for preprocedural laboratory examination: Secondary | ICD-10-CM | POA: Diagnosis not present

## 2017-10-03 DIAGNOSIS — Z01818 Encounter for other preprocedural examination: Secondary | ICD-10-CM | POA: Diagnosis not present

## 2017-10-03 DIAGNOSIS — E1149 Type 2 diabetes mellitus with other diabetic neurological complication: Secondary | ICD-10-CM | POA: Diagnosis not present

## 2017-10-03 DIAGNOSIS — I4510 Unspecified right bundle-branch block: Secondary | ICD-10-CM | POA: Diagnosis not present

## 2017-10-03 DIAGNOSIS — Z79899 Other long term (current) drug therapy: Secondary | ICD-10-CM | POA: Diagnosis not present

## 2017-10-03 DIAGNOSIS — E1140 Type 2 diabetes mellitus with diabetic neuropathy, unspecified: Secondary | ICD-10-CM | POA: Diagnosis not present

## 2017-10-03 HISTORY — DX: Unspecified right bundle-branch block: I45.10

## 2017-10-03 HISTORY — DX: Hyperlipidemia, unspecified: E78.5

## 2017-10-03 HISTORY — DX: Tinnitus, bilateral: H93.13

## 2017-10-03 LAB — SURGICAL PCR SCREEN
MRSA, PCR: NEGATIVE
Staphylococcus aureus: NEGATIVE

## 2017-10-03 NOTE — Progress Notes (Signed)
Per patient request, fax request sent to [facility] phys at [place] for last labs, EKG, and LOV

## 2017-10-04 LAB — ABO/RH: ABO/RH(D): O POS

## 2017-10-06 NOTE — Progress Notes (Addendum)
LOV WITH DR Jonathon Jordan 10-03-17 INCLUDING CBCDIFF , CMP, HGBA1C 10-03-17 ON CHART FROM [facility] [place]

## 2017-10-10 ENCOUNTER — Encounter (HOSPITAL_COMMUNITY): Payer: Self-pay | Admitting: Anesthesiology

## 2017-10-10 NOTE — Anesthesia Preprocedure Evaluation (Deleted)
Anesthesia Evaluation  Patient identified by MRN, date of birth, ID band Patient awake    Reviewed: Allergy & Precautions, NPO status , Patient's Chart, lab work & pertinent test results, reviewed documented beta blocker date and time   Airway Mallampati: II  TM Distance: >3 FB Neck ROM: Full    Dental no notable dental hx. (+) Teeth Intact, Dental Advisory Given   Pulmonary neg pulmonary ROS, former smoker,    Pulmonary exam normal breath sounds clear to auscultation       Cardiovascular Exercise Tolerance: Good hypertension, Pt. on medications and Pt. on home beta blockers negative cardio ROS Normal cardiovascular exam Rhythm:Regular Rate:Normal     Neuro/Psych CVA negative neurological ROS  negative psych ROS   GI/Hepatic negative GI ROS, Neg liver ROS, GERD  Medicated,  Endo/Other  diabetes  Renal/GU Renal disease  negative genitourinary   Musculoskeletal   Abdominal (+) + obese,   Peds  Hematology   Anesthesia Other Findings   Reproductive/Obstetrics negative OB ROS                             Lab Results  Component Value Date   WBC 10.3 03/12/2016   HGB 13.7 03/12/2016   HCT 41.4 03/12/2016   MCV 90.6 03/12/2016   PLT 240 03/12/2016    Anesthesia Physical Anesthesia Plan  ASA: III  Anesthesia Plan: Spinal and Regional   Post-op Pain Management:  Regional for Post-op pain   Induction:   PONV Risk Score and Plan: Treatment may vary due to age or medical condition  Airway Management Planned: Mask, Natural Airway and Nasal Cannula  Additional Equipment:   Intra-op Plan:   Post-operative Plan:   Informed Consent: I have reviewed the patients History and Physical, chart, labs and discussed the procedure including the risks, benefits and alternatives for the proposed anesthesia with the patient or authorized representative who has indicated his/her understanding and  acceptance.     Plan Discussed with: CRNA  Anesthesia Plan Comments:         Anesthesia Quick Evaluation

## 2017-10-11 ENCOUNTER — Encounter (HOSPITAL_COMMUNITY): Admission: RE | Payer: Self-pay | Source: Other Acute Inpatient Hospital

## 2017-10-11 ENCOUNTER — Inpatient Hospital Stay (HOSPITAL_COMMUNITY)
Admission: RE | Admit: 2017-10-11 | Payer: Medicare HMO | Source: Other Acute Inpatient Hospital | Admitting: Orthopedic Surgery

## 2017-10-11 LAB — TYPE AND SCREEN
ABO/RH(D): O POS
Antibody Screen: NEGATIVE

## 2017-10-11 SURGERY — ARTHROPLASTY, KNEE, TOTAL
Anesthesia: Spinal | Site: Knee | Laterality: Left

## 2017-10-11 MED ORDER — PROPOFOL 10 MG/ML IV BOLUS
INTRAVENOUS | Status: AC
  Filled 2017-10-11: qty 40

## 2017-10-14 IMAGING — MG MM SCREEN MAMMOGRAM BILATERAL
5 series · 5 of 5 positions shown · non-contrast
Comparison: None.

CLINICAL DATA: Screening.

EXAM:
DIGITAL SCREENING BILATERAL MAMMOGRAM WITH CAD

[R CC]
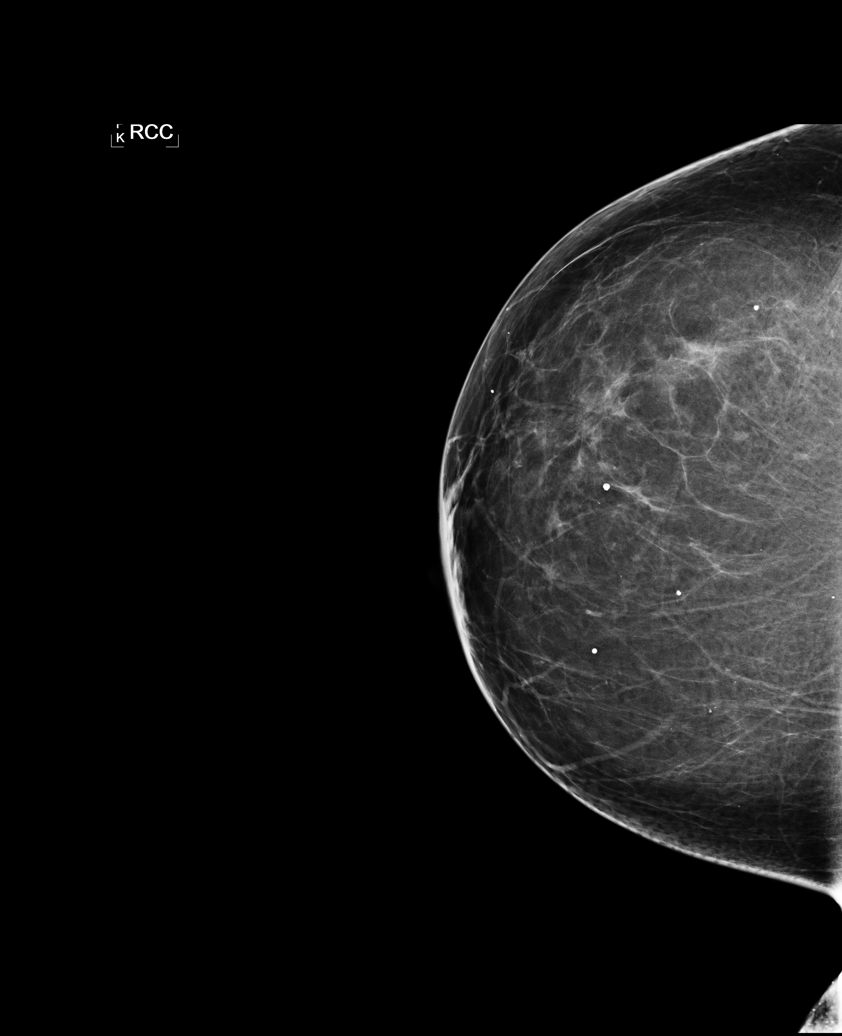

[L CC]
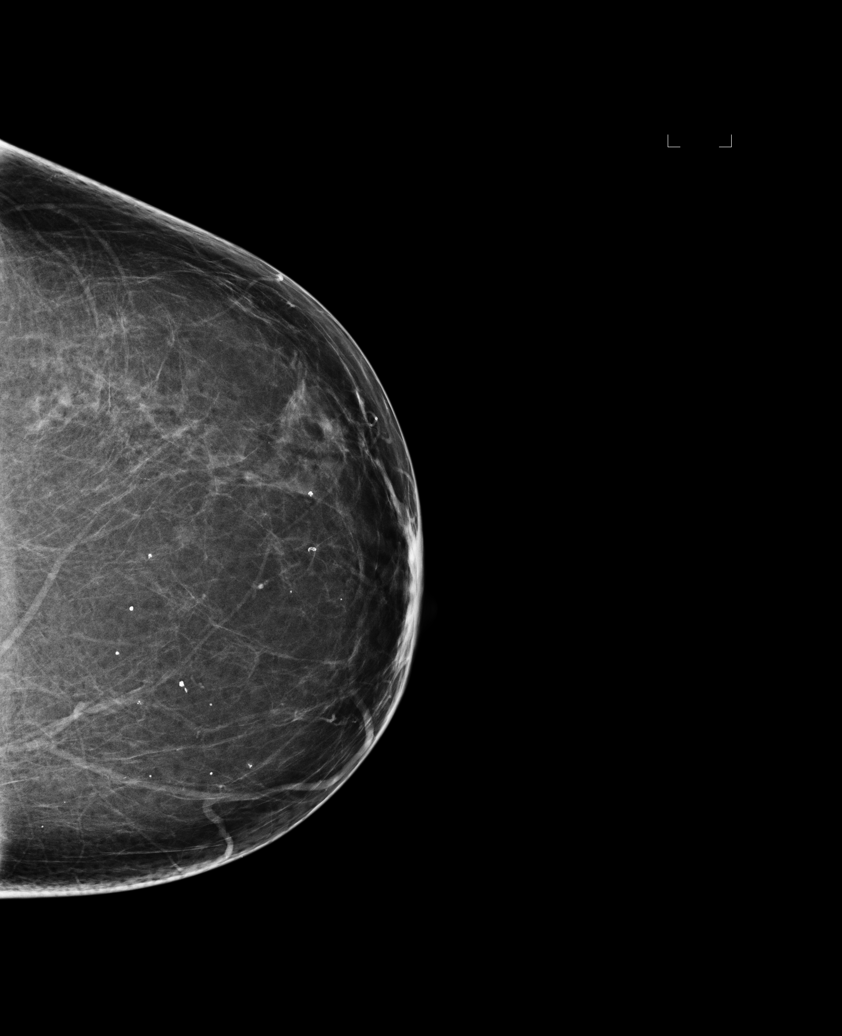

[L MLO]
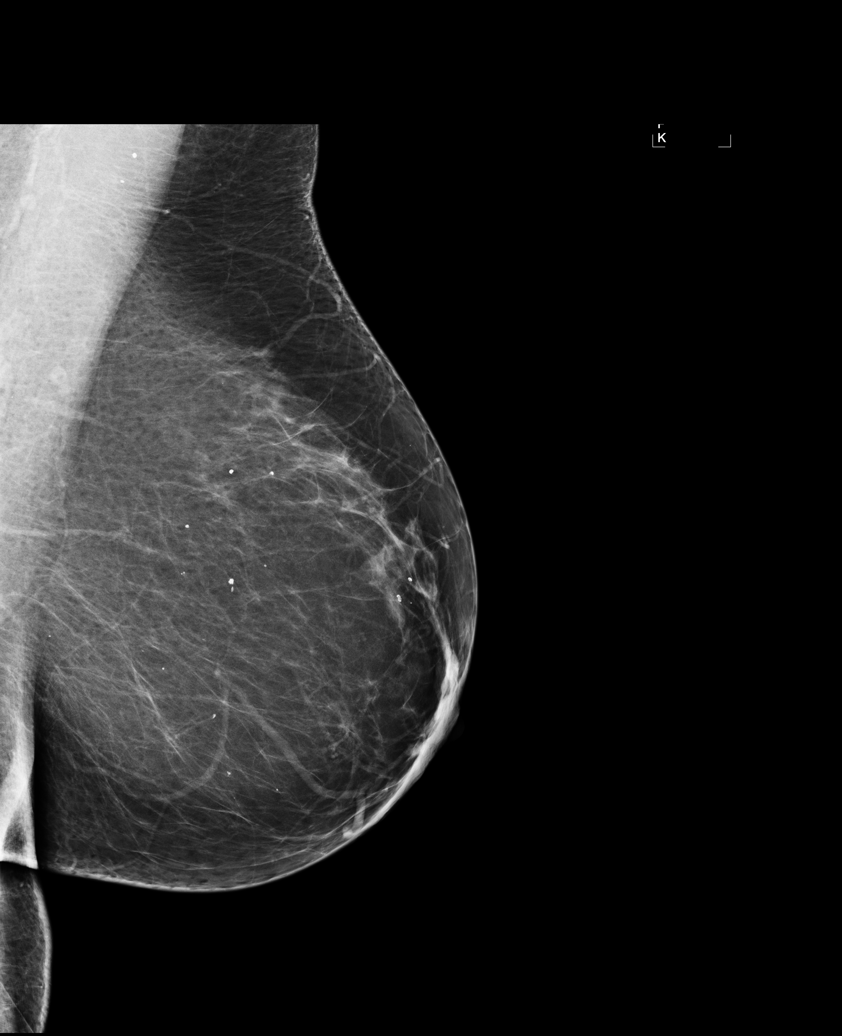

[R MLO (1 of 2)]
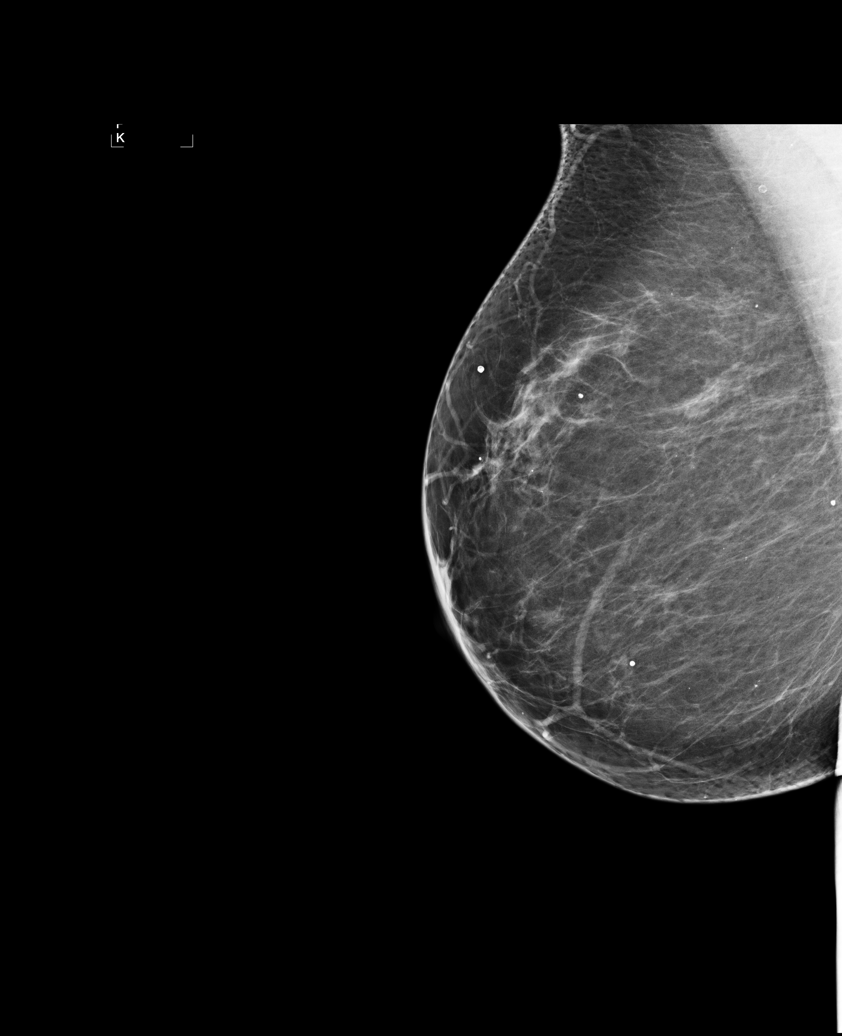

[R MLO (2 of 2)]
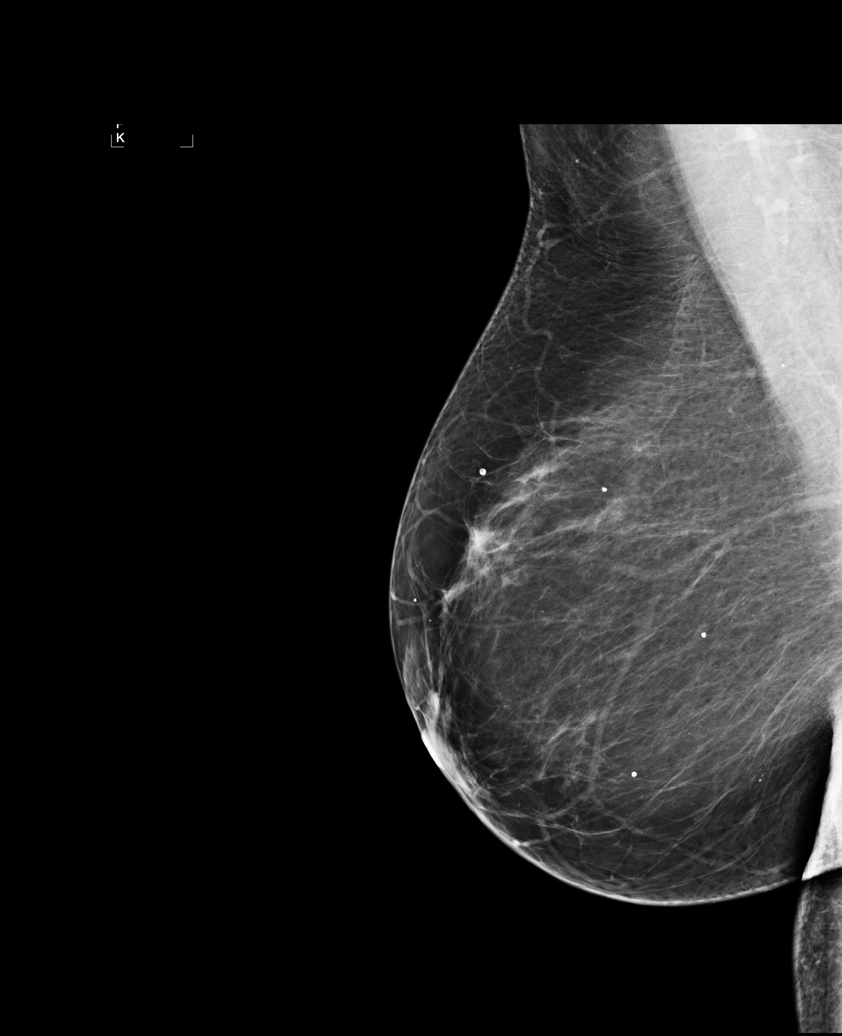

[5 of 5 positions shown; findings below may reference images not displayed]

ACR Breast Density Category b: There are scattered areas of
fibroglandular density.
FINDINGS: There are no findings suspicious for malignancy. Images were
processed with CAD.
IMPRESSION: No mammographic evidence of malignancy. A result letter of this
screening mammogram will be mailed directly to the patient.

RECOMMENDATION:
Screening mammogram in one year. (Code:[id number])

BI-RADS CATEGORY  1: Negative.

## 2017-11-11 DIAGNOSIS — L821 Other seborrheic keratosis: Secondary | ICD-10-CM | POA: Diagnosis not present

## 2017-11-11 DIAGNOSIS — L570 Actinic keratosis: Secondary | ICD-10-CM | POA: Diagnosis not present

## 2017-11-11 DIAGNOSIS — D2372 Other benign neoplasm of skin of left lower limb, including hip: Secondary | ICD-10-CM | POA: Diagnosis not present

## 2017-11-11 DIAGNOSIS — L7211 Pilar cyst: Secondary | ICD-10-CM | POA: Diagnosis not present

## 2017-11-25 DIAGNOSIS — E119 Type 2 diabetes mellitus without complications: Secondary | ICD-10-CM | POA: Diagnosis not present

## 2017-11-25 DIAGNOSIS — Z23 Encounter for immunization: Secondary | ICD-10-CM | POA: Diagnosis not present

## 2017-12-08 DIAGNOSIS — L720 Epidermal cyst: Secondary | ICD-10-CM | POA: Diagnosis not present

## 2017-12-08 DIAGNOSIS — L7211 Pilar cyst: Secondary | ICD-10-CM | POA: Diagnosis not present

## 2018-01-19 DIAGNOSIS — Z01818 Encounter for other preprocedural examination: Secondary | ICD-10-CM | POA: Diagnosis not present

## 2018-01-19 DIAGNOSIS — E1165 Type 2 diabetes mellitus with hyperglycemia: Secondary | ICD-10-CM | POA: Diagnosis not present

## 2018-01-19 DIAGNOSIS — Z79899 Other long term (current) drug therapy: Secondary | ICD-10-CM | POA: Diagnosis not present

## 2018-01-23 ENCOUNTER — Telehealth: Payer: Self-pay | Admitting: Cardiovascular Disease

## 2018-01-23 NOTE — Telephone Encounter (Signed)
New Message:      Blanch Media from Dr. Alessandra Grout office is calling to check on the clearance for this pt. Please submit the fax to [phone] as well as to [facility].

## 2018-01-23 NOTE — Telephone Encounter (Signed)
Attempted to reach requesting office. Left voicemail  instructing them to call the preop pool and let us know if they have received clearance.

## 2018-01-23 NOTE — Telephone Encounter (Signed)
Preop call back. Please call requesting provider to confirm receipt.

## 2018-01-23 NOTE — Telephone Encounter (Signed)
Clearance note faxed to the requesting provider.

## 2018-01-24 NOTE — Telephone Encounter (Signed)
   Primary Cardiologist: Skeet Latch, MD  Chart reviewed as part of pre-operative protocol coverage. Patient was contacted 01/24/2018 in reference to pre-operative risk assessment for pending surgery as outlined below.  Lexi Conaty was last seen on 09/30/17 by Kerin Ransom.  Since that day, Noah Pelaez has done well with no chest discomfort, shortness of breath or palpitations.  Activity is limited due to knee pain  Therefore, based on ACC/AHA guidelines, the patient would be at acceptable risk for the planned procedure without further cardiovascular testing.   I will route this recommendation to the requesting party via Epic fax function and remove from pre-op pool.  Per Dr. Oval Linsey, okay to stop Plavix 5 days preop, she would prefer the patient to remain on aspirin 81 mg if possible but will defer to Dr. Alvan Dame.  Please call with questions.  Daune Perch, NP 01/24/2018, 4:02 PM

## 2018-01-24 NOTE — Telephone Encounter (Signed)
Ortho is requesting clearance again, I will use this info that was conveyed in July.   Request for surgical clearance:  1. What type of surgery is being performed? LEFT TOTAL KNEE ARTHROPLASTY  2. When is this surgery scheduled?   3. What type of clearance is required (medical clearance vs. Pharmacy clearance to hold med vs. Both)? PHARMACY CLEARANCE TO HOLD MED   4. Are there any medications that need to be held prior to surgery and how long? COUMADIN  5. Practice name and name of physician performing surgery? [facility] - DR. OLIN  6. What is your office phone number [phone]   7.   What is your office fax number [phone] - [place]  8.   Anesthesia type (None, local, MAC, general) ? SPINAL   Jacinta Shoe 09/13/2017, 11:45 AM

## 2018-02-02 ENCOUNTER — Telehealth: Payer: Self-pay | Admitting: Cardiovascular Disease

## 2018-02-02 NOTE — Telephone Encounter (Signed)
Received fax from [facility] Dr. Stephanie Acre' office requesting clearance for patient's knee surgery - when she should hold & resume plavix. LM for Dr. Stephanie Acre' nurse that this has been addressed in Epic and sent to Dr. Alvan Dame and if they also need this notice, we can send to them if requested.

## 2018-03-07 DIAGNOSIS — R109 Unspecified abdominal pain: Secondary | ICD-10-CM | POA: Diagnosis not present

## 2018-03-09 DIAGNOSIS — K5792 Diverticulitis of intestine, part unspecified, without perforation or abscess without bleeding: Secondary | ICD-10-CM | POA: Diagnosis not present

## 2018-04-21 ENCOUNTER — Other Ambulatory Visit: Payer: Self-pay | Admitting: Family Medicine

## 2018-04-21 DIAGNOSIS — Z1231 Encounter for screening mammogram for malignant neoplasm of breast: Secondary | ICD-10-CM

## 2018-05-29 ENCOUNTER — Ambulatory Visit
Admission: RE | Admit: 2018-05-29 | Discharge: 2018-05-29 | Disposition: A | Discharge status: Home / Self Care | Payer: Medicare HMO | Source: Ambulatory Visit | Attending: Family Medicine | Admitting: Family Medicine

## 2018-05-29 DIAGNOSIS — Z1231 Encounter for screening mammogram for malignant neoplasm of breast: Secondary | ICD-10-CM | POA: Diagnosis not present

## 2018-06-06 DIAGNOSIS — H04123 Dry eye syndrome of bilateral lacrimal glands: Secondary | ICD-10-CM | POA: Diagnosis not present

## 2018-06-06 DIAGNOSIS — E119 Type 2 diabetes mellitus without complications: Secondary | ICD-10-CM | POA: Diagnosis not present

## 2018-06-06 DIAGNOSIS — Z961 Presence of intraocular lens: Secondary | ICD-10-CM | POA: Diagnosis not present

## 2018-06-24 ENCOUNTER — Encounter: Payer: Self-pay | Admitting: *Deleted

## 2018-07-11 ENCOUNTER — Telehealth: Payer: Self-pay | Admitting: *Deleted

## 2018-07-11 NOTE — Telephone Encounter (Signed)
07/11/18 LMOM @ 0923am, MA:YOKHTX up appointment./D.Zephyra Bernardi

## 2018-08-08 DIAGNOSIS — Z79899 Other long term (current) drug therapy: Secondary | ICD-10-CM | POA: Diagnosis not present

## 2018-08-08 DIAGNOSIS — E559 Vitamin D deficiency, unspecified: Secondary | ICD-10-CM | POA: Diagnosis not present

## 2018-08-08 DIAGNOSIS — I10 Essential (primary) hypertension: Secondary | ICD-10-CM | POA: Diagnosis not present

## 2018-08-08 DIAGNOSIS — E1149 Type 2 diabetes mellitus with other diabetic neurological complication: Secondary | ICD-10-CM | POA: Diagnosis not present

## 2018-08-11 DIAGNOSIS — I10 Essential (primary) hypertension: Secondary | ICD-10-CM | POA: Diagnosis not present

## 2018-08-11 DIAGNOSIS — G459 Transient cerebral ischemic attack, unspecified: Secondary | ICD-10-CM | POA: Diagnosis not present

## 2018-08-11 DIAGNOSIS — E1140 Type 2 diabetes mellitus with diabetic neuropathy, unspecified: Secondary | ICD-10-CM | POA: Diagnosis not present

## 2018-08-11 DIAGNOSIS — M810 Age-related osteoporosis without current pathological fracture: Secondary | ICD-10-CM | POA: Diagnosis not present

## 2018-08-11 DIAGNOSIS — E1149 Type 2 diabetes mellitus with other diabetic neurological complication: Secondary | ICD-10-CM | POA: Diagnosis not present

## 2018-08-11 DIAGNOSIS — E559 Vitamin D deficiency, unspecified: Secondary | ICD-10-CM | POA: Diagnosis not present

## 2018-08-11 DIAGNOSIS — B356 Tinea cruris: Secondary | ICD-10-CM | POA: Diagnosis not present

## 2018-08-11 DIAGNOSIS — I4510 Unspecified right bundle-branch block: Secondary | ICD-10-CM | POA: Diagnosis not present

## 2018-08-11 DIAGNOSIS — Z0000 Encounter for general adult medical examination without abnormal findings: Secondary | ICD-10-CM | POA: Diagnosis not present

## 2018-09-18 DIAGNOSIS — M47896 Other spondylosis, lumbar region: Secondary | ICD-10-CM | POA: Diagnosis not present

## 2018-09-18 DIAGNOSIS — M545 Low back pain: Secondary | ICD-10-CM | POA: Diagnosis not present

## 2018-09-21 DIAGNOSIS — M545 Low back pain: Secondary | ICD-10-CM | POA: Diagnosis not present

## 2018-09-27 DIAGNOSIS — M47896 Other spondylosis, lumbar region: Secondary | ICD-10-CM | POA: Diagnosis not present

## 2018-10-06 DIAGNOSIS — R509 Fever, unspecified: Secondary | ICD-10-CM | POA: Diagnosis not present

## 2018-10-27 DIAGNOSIS — M6530 Trigger finger, unspecified finger: Secondary | ICD-10-CM | POA: Diagnosis not present

## 2018-10-27 DIAGNOSIS — M4856XD Collapsed vertebra, not elsewhere classified, lumbar region, subsequent encounter for fracture with routine healing: Secondary | ICD-10-CM | POA: Diagnosis not present

## 2018-12-11 DIAGNOSIS — M79641 Pain in right hand: Secondary | ICD-10-CM | POA: Diagnosis not present

## 2018-12-11 DIAGNOSIS — M65321 Trigger finger, right index finger: Secondary | ICD-10-CM | POA: Diagnosis not present

## 2018-12-15 DIAGNOSIS — Z23 Encounter for immunization: Secondary | ICD-10-CM | POA: Diagnosis not present

## 2019-02-13 ENCOUNTER — Ambulatory Visit: Payer: Medicare HMO | Admitting: Cardiovascular Disease

## 2019-02-13 DIAGNOSIS — R350 Frequency of micturition: Secondary | ICD-10-CM | POA: Diagnosis not present

## 2019-02-13 DIAGNOSIS — I10 Essential (primary) hypertension: Secondary | ICD-10-CM | POA: Diagnosis not present

## 2019-02-13 DIAGNOSIS — Z79899 Other long term (current) drug therapy: Secondary | ICD-10-CM | POA: Diagnosis not present

## 2019-02-13 DIAGNOSIS — E119 Type 2 diabetes mellitus without complications: Secondary | ICD-10-CM | POA: Diagnosis not present

## 2019-02-13 DIAGNOSIS — G459 Transient cerebral ischemic attack, unspecified: Secondary | ICD-10-CM | POA: Diagnosis not present

## 2019-03-22 ENCOUNTER — Telehealth (INDEPENDENT_AMBULATORY_CARE_PROVIDER_SITE_OTHER): Payer: Medicare HMO | Admitting: Cardiovascular Disease

## 2019-03-22 ENCOUNTER — Encounter: Payer: Self-pay | Admitting: Cardiovascular Disease

## 2019-03-22 VITALS — BP 130/69 | HR 66 | Ht 63.5 in | Wt 215.6 lb

## 2019-03-22 DIAGNOSIS — I6523 Occlusion and stenosis of bilateral carotid arteries: Secondary | ICD-10-CM | POA: Diagnosis not present

## 2019-03-22 DIAGNOSIS — I10 Essential (primary) hypertension: Secondary | ICD-10-CM | POA: Diagnosis not present

## 2019-03-22 DIAGNOSIS — E7800 Pure hypercholesterolemia, unspecified: Secondary | ICD-10-CM | POA: Diagnosis not present

## 2019-03-22 DIAGNOSIS — I639 Cerebral infarction, unspecified: Secondary | ICD-10-CM | POA: Diagnosis not present

## 2019-03-22 NOTE — Progress Notes (Signed)
Virtual Visit via Video Note   This visit type was conducted due to national recommendations for restrictions regarding the COVID-19 Pandemic (e.g. social distancing) in an effort to limit this patient's exposure and mitigate transmission in our community.  Due to her co-morbid illnesses, this patient is at least at moderate risk for complications without adequate follow up.  This format is felt to be most appropriate for this patient at this time.  All issues noted in this document were discussed and addressed.  A limited physical exam was performed with this format.  Please refer to the patient's chart for her consent to telehealth for [facility].   Date:  03/22/2019   ID:  Stephanie Luna, DOB 1945/07/21, MRN [id number]  Patient Location: Home Provider Location: Home  PCP:  Jonathon Jordan, MD  Cardiologist:  Skeet Latch, MD  Electrophysiologist:  None   Evaluation Performed:  Follow-Up Visit  Chief Complaint:  Shortness of breath  History of Present Illness:    Stephanie Luna is a 73 y.o. female with stroke, diabetes, transient RBBB,  hypertension, and hyperlipidemia who presents for follow up.  Stephanie Luna first established care 10/2015.  She had a LHC in 2012 that was reportedly negative for obstructive coronary disease.  She notes occasional episodes of chest pain that she attributes to her hiatal hernia.  Lately she has been feeling well.  She walks her dog for exercise once daily.  She has no exertional chest pain but she does sometimes get short of breath if she tries to walk and talk at the same time.  She was planning to get her knee replaced but this was delayed due to COVID-19 and the weather.  She hopes to do it in the spring or summer.  She also had a lumbar compression fracture that was treated medically.  In general her BP has been well-controlled.  It is typically in the [id number] systolic.  She occasionally has LE edema if she eats too much salt.  She denies  orthopnea or PND.     The patient does not have symptoms concerning for COVID-19 infection (fever, chills, cough, or new shortness of breath).    Past Medical History:  Diagnosis Date  . CVA (cerebral vascular accident) ([place]) 2015   L parietal lobe, other ischemic infarcts seen, no occlusive vascular disease mentioned  . Diabetes mellitus without complication ([place])   . HLD (hyperlipidemia)   . Hypertension   . Kidney stones   . Right bundle branch block (RBBB)   . Tinnitus aurium, bilateral    Past Surgical History:  Procedure Laterality Date  . ABDOMINAL HYSTERECTOMY    . CATARACT EXTRACTION, BILATERAL    . Colocystectomy     . exploratory laparotomy      x2 ; 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 ;   . MENISECTOMY Left age 10   subseqeuntly did revision to clean out the scar issue years later   . TONSILLECTOMY       Current Meds  Medication Sig  . acetaminophen (TYLENOL) 500 MG tablet Take 1000 mg by mouth 3 times daily  . Ascorbic Acid (VITAMIN C) 500 MG CHEW Chew 1 each by mouth daily.  . Calcium Citrate-Vitamin D (CALCIUM CITRATE + D) 315-250 MG-UNIT TABS Take 2 tablets by mouth once daily in the evening  . cholecalciferol (VITAMIN D) 1000 units tablet Take 1,000 Units by mouth every evening.  . clopidogrel (PLAVIX) 75 MG tablet Take 75 mg by mouth once daily  .  losartan (COZAAR) 50 MG tablet Take 25 mg by mouth once daily  . metoprolol succinate (TOPROL-XL) 50 MG 24 hr tablet Take 75mg  by mouth once daily in the eveing  . Multiple Vitamin (MULTIVITAMIN) tablet Take 1 tablet by mouth daily.  . Multiple Vitamins-Minerals (EYE VITAMINS PO) Take 1 tablet by mouth 2 (two) times daily.  Marland Kitchen omeprazole (PRILOSEC) 40 MG capsule Take 40 mg by mouth once daily  . simvastatin (ZOCOR) 40 MG tablet Take 40 mg by mouth once daily in the evening     Allergies:   Metformin and related and Codeine   Social History   Tobacco Use  . Smoking status: Former Smoker    Quit date: 10/03/2013     Years since quitting: 5.4  . Smokeless tobacco: Never Used  Substance Use Topics  . Alcohol use: No  . Drug use: No     Family Hx: The patient's family history includes Breast cancer in her sister; Colon cancer in her father.  ROS:   Please see the history of present illness.     All other systems reviewed and are negative.   Prior CV studies:   The following studies were reviewed today:  Echo 08/29/13: LVEF greater than 70%. Mild LVH. Diastolic dysfunction  Nuclear stress 11/13/15: LVEF 85%.  No ischemia Carotid Doppler 11/13/15: Mild bilateral ICA stenosis    Labs/Other Tests and Data Reviewed:    EKG:  No ECG reviewed.  Recent Labs: No results found for requested labs within last 8760 hours.   Recent Lipid Panel No results found for: CHOL, TRIG, HDL, CHOLHDL, LDLCALC, LDLDIRECT  Wt Readings from Last 3 Encounters:  03/22/19 215 lb 9.6 oz (97.8 kg)  09/30/17 209 lb (94.8 kg)  05/13/16 206 lb (93.4 kg)     Objective:    Vital Signs:  BP 130/69   Pulse 66   Ht 5' 3.5" (1.613 m)   Wt 215 lb 9.6 oz (97.8 kg)   BMI 37.59 kg/m    VITAL SIGNS:  reviewed GEN:  no acute distress EYES:  sclerae anicteric, EOMI - Extraocular Movements Intact RESPIRATORY:  normal respiratory effort, symmetric expansion CARDIOVASCULAR:  no peripheral edema SKIN:  no rash, lesions or ulcers. MUSCULOSKELETAL:  no obvious deformities. NEURO:  alert and oriented x 3, no obvious focal deficit PSYCH:  normal affect  ASSESSMENT & PLAN:    # Atypical chest pain: Resolved.  She has no exertional chest pain.  # Hypertension: Blood pressure remains well-controlled.  Continue losartan and metoprolol.  # Hyperlipidemia:  LDL 66 03/2015.  Continue simvastatin.  Lipids were checked with her PCP yesterday. She will forward these results to [place].  # Stroke: Continue clopidogrel.  LDL is well-controlled.  # Carotid stenosis: Mild bilateral stenosis in 2017.  Repeat carotid Dopplers at follow  up.   COVID-19 Education: The signs and symptoms of COVID-19 were discussed with the patient and how to seek care for testing (follow up with PCP or arrange E-visit).  The importance of social distancing was discussed today.  Time:   Today, I have spent 20 minutes with the patient with telehealth technology discussing the above problems.     Medication Adjustments/Labs and Tests Ordered: Current medicines are reviewed at length with the patient today.  Concerns regarding medicines are outlined above.   Tests Ordered: No orders of the defined types were placed in this encounter.   Medication Changes: No orders of the defined types were placed in this encounter.  Follow Up:  Either In Person or Virtual in 1 year(s)  Signed, Skeet Latch, MD  03/22/2019 9:24 AM    [place]

## 2019-03-22 NOTE — Patient Instructions (Addendum)
Medication Instructions:  No changes  *If you need a refill on your cardiac medications before your next appointment, please call your pharmacy*  Lab Work: Not needed  Testing/Procedures: Not needed  Follow-Up: At [facility], you and your health needs are our priority.  As part of our continuing mission to provide you with exceptional heart care, we have created designated Provider Care Teams.  These Care Teams include your primary Cardiologist (physician) and Advanced Practice Providers (APPs -  Physician Assistants and Nurse Practitioners) who all work together to provide you with the care you need, when you need it.  Your next appointment:   1 year(s)  The format for your next appointment:   In Person  Provider:   Skeet Latch, MD

## 2019-05-16 ENCOUNTER — Other Ambulatory Visit: Payer: Self-pay | Admitting: Family Medicine

## 2019-05-16 DIAGNOSIS — Z1231 Encounter for screening mammogram for malignant neoplasm of breast: Secondary | ICD-10-CM

## 2019-06-18 ENCOUNTER — Other Ambulatory Visit: Payer: Self-pay

## 2019-06-18 ENCOUNTER — Ambulatory Visit
Admission: RE | Admit: 2019-06-18 | Discharge: 2019-06-18 | Disposition: A | Discharge status: Home / Self Care | Payer: Medicare HMO | Source: Ambulatory Visit | Attending: Family Medicine | Admitting: Family Medicine

## 2019-06-18 DIAGNOSIS — Z1231 Encounter for screening mammogram for malignant neoplasm of breast: Secondary | ICD-10-CM | POA: Diagnosis not present

## 2019-07-04 ENCOUNTER — Telehealth: Payer: Self-pay | Admitting: Cardiovascular Disease

## 2019-07-04 NOTE — Telephone Encounter (Signed)
[place] from Dr. Marguerita Beards office called to see if patient should have held blood thinner prior to her teeth extractions. Transferred call to [facility].

## 2019-07-05 ENCOUNTER — Encounter: Payer: Self-pay | Admitting: Pharmacist Clinician (PhC)/ Clinical Pharmacy Specialist

## 2019-08-14 DIAGNOSIS — N200 Calculus of kidney: Secondary | ICD-10-CM | POA: Diagnosis not present

## 2019-08-14 DIAGNOSIS — M810 Age-related osteoporosis without current pathological fracture: Secondary | ICD-10-CM | POA: Diagnosis not present

## 2019-08-14 DIAGNOSIS — N3281 Overactive bladder: Secondary | ICD-10-CM | POA: Diagnosis not present

## 2019-08-14 DIAGNOSIS — D692 Other nonthrombocytopenic purpura: Secondary | ICD-10-CM | POA: Diagnosis not present

## 2019-08-14 DIAGNOSIS — Z0000 Encounter for general adult medical examination without abnormal findings: Secondary | ICD-10-CM | POA: Diagnosis not present

## 2019-08-14 DIAGNOSIS — E1169 Type 2 diabetes mellitus with other specified complication: Secondary | ICD-10-CM | POA: Diagnosis not present

## 2019-08-14 DIAGNOSIS — E785 Hyperlipidemia, unspecified: Secondary | ICD-10-CM | POA: Diagnosis not present

## 2019-08-14 DIAGNOSIS — I10 Essential (primary) hypertension: Secondary | ICD-10-CM | POA: Diagnosis not present

## 2019-08-14 DIAGNOSIS — E559 Vitamin D deficiency, unspecified: Secondary | ICD-10-CM | POA: Diagnosis not present

## 2019-08-14 DIAGNOSIS — E2839 Other primary ovarian failure: Secondary | ICD-10-CM | POA: Diagnosis not present

## 2019-08-14 DIAGNOSIS — Z79899 Other long term (current) drug therapy: Secondary | ICD-10-CM | POA: Diagnosis not present

## 2019-08-14 DIAGNOSIS — I4510 Unspecified right bundle-branch block: Secondary | ICD-10-CM | POA: Diagnosis not present

## 2019-08-15 ENCOUNTER — Other Ambulatory Visit: Payer: Self-pay | Admitting: Family Medicine

## 2019-08-15 DIAGNOSIS — E2839 Other primary ovarian failure: Secondary | ICD-10-CM

## 2019-10-26 DIAGNOSIS — M549 Dorsalgia, unspecified: Secondary | ICD-10-CM | POA: Diagnosis not present

## 2019-10-26 DIAGNOSIS — R109 Unspecified abdominal pain: Secondary | ICD-10-CM | POA: Diagnosis not present

## 2019-11-09 ENCOUNTER — Ambulatory Visit
Admission: RE | Admit: 2019-11-09 | Discharge: 2019-11-09 | Disposition: A | Discharge status: Home / Self Care | Payer: Medicare HMO | Source: Ambulatory Visit | Attending: Family Medicine | Admitting: Family Medicine

## 2019-11-09 ENCOUNTER — Other Ambulatory Visit: Payer: Self-pay

## 2019-11-09 DIAGNOSIS — E2839 Other primary ovarian failure: Secondary | ICD-10-CM

## 2019-11-09 DIAGNOSIS — Z780 Asymptomatic menopausal state: Secondary | ICD-10-CM | POA: Diagnosis not present

## 2019-11-09 DIAGNOSIS — M8589 Other specified disorders of bone density and structure, multiple sites: Secondary | ICD-10-CM | POA: Diagnosis not present

## 2019-12-18 DIAGNOSIS — Z23 Encounter for immunization: Secondary | ICD-10-CM | POA: Diagnosis not present

## 2020-01-28 DIAGNOSIS — H26493 Other secondary cataract, bilateral: Secondary | ICD-10-CM | POA: Diagnosis not present

## 2020-01-28 DIAGNOSIS — E119 Type 2 diabetes mellitus without complications: Secondary | ICD-10-CM | POA: Diagnosis not present

## 2020-01-28 DIAGNOSIS — H04123 Dry eye syndrome of bilateral lacrimal glands: Secondary | ICD-10-CM | POA: Diagnosis not present

## 2020-01-28 DIAGNOSIS — Z961 Presence of intraocular lens: Secondary | ICD-10-CM | POA: Diagnosis not present

## 2020-03-25 ENCOUNTER — Telehealth: Payer: Self-pay | Admitting: Cardiovascular Disease

## 2020-03-25 NOTE — Telephone Encounter (Signed)
Patient would like to pick up any paperwork that needs to be filled out prior to her appt on 03/27/20 with Dr. Duke Salvia. She states she does not do good filling out the information in the office. Please advise..she will be out this afternoon.

## 2020-03-25 NOTE — Telephone Encounter (Signed)
Confirmed with front desk staff on any paperwork the pt might need for her appointment with Dr. Duke Salvia.  No paperwork needed for pt to fill out. Spoke with pt and told her this as well as to bring her insurance cards with her to the appointment.

## 2020-03-27 ENCOUNTER — Ambulatory Visit: Payer: Medicare HMO | Admitting: Cardiovascular Disease

## 2020-03-27 ENCOUNTER — Other Ambulatory Visit: Payer: Self-pay

## 2020-03-27 ENCOUNTER — Encounter: Payer: Self-pay | Admitting: Cardiovascular Disease

## 2020-03-27 VITALS — BP 122/80 | HR 67 | Ht 63.5 in | Wt 220.6 lb

## 2020-03-27 DIAGNOSIS — G459 Transient cerebral ischemic attack, unspecified: Secondary | ICD-10-CM

## 2020-03-27 DIAGNOSIS — E7800 Pure hypercholesterolemia, unspecified: Secondary | ICD-10-CM | POA: Diagnosis not present

## 2020-03-27 DIAGNOSIS — Z8673 Personal history of transient ischemic attack (TIA), and cerebral infarction without residual deficits: Secondary | ICD-10-CM

## 2020-03-27 DIAGNOSIS — I6782 Cerebral ischemia: Secondary | ICD-10-CM | POA: Diagnosis not present

## 2020-03-27 DIAGNOSIS — I4510 Unspecified right bundle-branch block: Secondary | ICD-10-CM

## 2020-03-27 DIAGNOSIS — I10 Essential (primary) hypertension: Secondary | ICD-10-CM

## 2020-03-27 MED ORDER — ROSUVASTATIN CALCIUM 40 MG PO TABS: 40.0000 mg | ORAL_TABLET | Freq: Every day | ORAL | 3 refills | Status: DC

## 2020-03-27 NOTE — Progress Notes (Signed)
Cardiology Office Note   Date:  03/27/2020   ID:  Stephanie Luna, DOB 02-May-1945, MRN [id number]  PCP:  Mila Palmer, MD  Cardiologist:  Chilton Si, MD  Electrophysiologist:  None   Evaluation Performed:  Follow-Up Visit  Chief Complaint:  TIA  History of Present Illness:    Stephanie Luna is a 75 y.o. female with stroke, diabetes, RBBB,  hypertension, and hyperlipidemia who presents for follow up.  Stephanie Luna first established care 10/2015.  She had a LHC in 2012 that was reportedly negative for obstructive coronary disease.  She notes occasional episodes of chest pain that she attributes to her hiatal hernia.  Lately she has been feeling well.  She walks her dog for exercise once daily.  She has no exertional chest pain but she does sometimes get short of breath if she tries to walk and talk at the same time.  She was planning to get her knee replaced but this was delayed due to COVID-19 and the weather.  She hopes to do it in the spring or summer.  She also had a lumbar compression fracture that was treated medically.  In general her BP has been well-controlled.  It is typically in the 120s systolic.  She occasionally has LE edema if she eats too much salt.  She denies orthopnea or PND.     Stephanie Luna had an episode of R arm weakness on 12/28.  She was travelling in [place] at the time.  She didn't have good control of her arm.  The episode lasted for several hours.  There was no associated pain, slurred speech, face drooping or aphasia.  Wither her TIA in 2015 he symptoms were profound dizziness and leg heaviness.  She went to an outside hospital and head CT reportedly showed prior stroke but nothing acute.  She was discharged and instructed to follow up with her PCP and a local cardiologist.  Since then she has felt well and has no residual symptoms.  She walks her dog daily 0.5 miles.  She has no exertional chest pain. If she goes too quickly she gets tired. She is active  in her house.  She struggles with orthopedic limitations.  She notes some swelling at times and attributes this to salt intake.  She tries to elevate her legs when sitting.  She denies orthopnea or PND, though she sleeps in a recliner due to back pain.  She brings a log of her BP showing that it ranges 110s-140s/60-80s before taking her morning medications.     Past Medical History:  Diagnosis Date  . CVA (cerebral vascular accident) ([facility]) 2015   L parietal lobe, other ischemic infarcts seen, no occlusive vascular disease mentioned  . Diabetes mellitus without complication ([facility])   . HLD (hyperlipidemia)   . Hypertension   . Kidney stones   . Right bundle branch block (RBBB)   . Tinnitus aurium, bilateral    Past Surgical History:  Procedure Laterality Date  . ABDOMINAL HYSTERECTOMY    . CATARACT EXTRACTION, BILATERAL    . Colocystectomy     . exploratory laparotomy      x2 ; 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 ;   . MENISECTOMY Left age 19   subseqeuntly did revision to clean out the scar issue years later   . TONSILLECTOMY       Current Meds  Medication Sig  . acetaminophen (TYLENOL) 500 MG tablet Take 1000 mg by mouth 3 times daily  .  Ascorbic Acid (VITAMIN C) 500 MG CHEW Chew 1 each by mouth daily.  . Calcium Citrate-Vitamin D 315-250 MG-UNIT TABS Take 2 tablets by mouth once daily in the evening  . cholecalciferol (VITAMIN D) 1000 units tablet Take 1,000 Units by mouth every evening.  . clopidogrel (PLAVIX) 75 MG tablet Take 75 mg by mouth once daily  . losartan (COZAAR) 50 MG tablet Take 25 mg by mouth once daily  . metoprolol succinate (TOPROL-XL) 50 MG 24 hr tablet Take 75mg  by mouth once daily in the eveing  . Multiple Vitamin (MULTIVITAMIN) tablet Take 1 tablet by mouth daily.  . Multiple Vitamins-Minerals (EYE VITAMINS PO) Take 1 tablet by mouth 2 (two) times daily.  Marland Kitchen omeprazole (PRILOSEC) 40 MG capsule Take 40 mg by mouth once daily  . pioglitazone (ACTOS) 30 MG tablet    . rosuvastatin (CRESTOR) 40 MG tablet Take 1 tablet (40 mg total) by mouth daily.  . TRUE METRIX BLOOD GLUCOSE TEST test strip   . TRUEplus Lancets 28G MISC   . [DISCONTINUED] simvastatin (ZOCOR) 40 MG tablet Take 40 mg by mouth once daily in the evening     Allergies:   Metformin and related and Codeine   Social History   Tobacco Use  . Smoking status: Former Smoker    Quit date: 10/03/2013    Years since quitting: 6.4  . Smokeless tobacco: Never Used  Substance Use Topics  . Alcohol use: No  . Drug use: No     Family Hx: The patient's family history includes Breast cancer in her sister; Colon cancer in her father.  ROS:   Please see the history of present illness.     All other systems reviewed and are negative.   Prior CV studies:   The following studies were reviewed today:  Echo 08/29/13: LVEF greater than 70%. Mild LVH. Diastolic dysfunction  Nuclear stress 11/13/15: LVEF 85%.  No ischemia Carotid Doppler 11/13/15: Mild bilateral ICA stenosis    Labs/Other Tests and Data Reviewed:    EKG:  An ECG dated 03/27/20 was personally reviewed today and demonstrated:  sinus rhythm.  Rate 67 bpm.  RBBB.  Low voltage.  Prior inferior infarct.    Recent Labs: No results found for requested labs within last 8760 hours.   Recent Lipid Panel No results found for: CHOL, TRIG, HDL, CHOLHDL, LDLCALC, LDLDIRECT  Wt Readings from Last 3 Encounters:  03/27/20 220 lb 9.6 oz (100.1 kg)  03/22/19 215 lb 9.6 oz (97.8 kg)  09/30/17 209 lb (94.8 kg)     Objective:    Vital Signs:  BP 122/80   Pulse 67   Ht 5' 3.5" (1.613 m)   Wt 220 lb 9.6 oz (100.1 kg)   BMI 38.46 kg/m    VITAL SIGNS:  reviewed GEN:  no acute distress EYES:  sclerae anicteric, EOMI - Extraocular Movements Intact RESPIRATORY:  normal respiratory effort, symmetric expansion CARDIOVASCULAR:  no peripheral edema SKIN:  no rash, lesions or ulcers. MUSCULOSKELETAL:  no obvious deformities. NEURO:  alert and  oriented x 3, no obvious focal deficit PSYCH:  normal affect  ASSESSMENT & PLAN:    # Atypical chest pain: Resolved.  She has no exertional chest pain.  # Hypertension: Blood pressure remains well-controlled.  Continue losartan and metoprolol.  # Stroke/TIA:   # Hyperlipidemia: She had an episode of TIA last month.  She was reportedly seen at an outside hospital.  We will get a copy of those records.  For  now, continue clopidogrel.  Lipids are not well-controlled.  We will switch simvastatin to rosuvastatin 40.  She will have lipids rechecked with her PCP in May.  Her LDL goal is less than 70 and her triglycerides to be less than 150.  # Carotid stenosis: Mild bilateral stenosis in 2017.  She is due for carotid imaging if it was not obtained at the outside hospital.  We will get a copy of those records.  Lipid management as above.    Medication Adjustments/Labs and Tests Ordered: Current medicines are reviewed at length with the patient today.  Concerns regarding medicines are outlined above.   Tests Ordered: Orders Placed This Encounter  Procedures  . Ambulatory referral to Neurology  . EKG 12-Lead    Medication Changes: Meds ordered this encounter  Medications  . rosuvastatin (CRESTOR) 40 MG tablet    Sig: Take 1 tablet (40 mg total) by mouth daily.    Dispense:  90 tablet    Refill:  3    D/C SIMVASTATIN    Follow Up:  Either In Person or Virtual in 1 year(s)  Signed, Skeet Latch, MD  03/27/2020 12:14 PM    [facility]

## 2020-03-27 NOTE — Patient Instructions (Addendum)
Medication Instructions:  STOP SIMVASTATIN   START ROSUVASTATIN 40 MG DAILY   *If you need a refill on your cardiac medications before your next appointment, please call your pharmacy*  Lab Work: HAVE PRIMARY CARE SEND YOUR LABS TO DR [facility]   Testing/Procedures: NONE   Follow-Up: At [facility], you and your health needs are our priority.  As part of our continuing mission to provide you with exceptional heart care, we have created designated Provider Care Teams.  These Care Teams include your primary Cardiologist (physician) and Advanced Practice Providers (APPs -  Physician Assistants and Nurse Practitioners) who all work together to provide you with the care you need, when you need it.  We recommend signing up for the patient portal called "MyChart".  Sign up information is provided on this After Visit Summary.  MyChart is used to connect with patients for Virtual Visits (Telemedicine).  Patients are able to view lab/test results, encounter notes, upcoming appointments, etc.  Non-urgent messages can be sent to your provider as well.   To learn more about what you can do with MyChart, go to [email or web address].    Your next appointment:   12 month(s)  You will receive a reminder letter in the mail two months in advance. If you don't receive a letter, please call our office to schedule the follow-up appointment.  The format for your next appointment:   In Person  Provider:   You may see Chilton Si, MD or one of the following Advanced Practice Providers on your designated Care Team:    Corine Shelter, PA-C  [place], [place]  Edd Fabian, FNP  You have been referred to    Where: [facility] Address: [address] [place] [address] Phone: [phone]  IF YOU DO NOT HEAR FROM THE OFFICE IN 2 WEEKS YOU CAN CALL THEM DIRECTLY AT THE NUMBER LISTED ABOVE

## 2020-04-01 ENCOUNTER — Telehealth: Payer: Self-pay | Admitting: Cardiovascular Disease

## 2020-04-01 NOTE — Telephone Encounter (Signed)
Stephanie Luna is calling stating there is inaccurate notes from her 03/27/20 appt with Dr. Oval Linsey that she would like to discuss. Please advise.

## 2020-04-01 NOTE — Telephone Encounter (Signed)
Left message to call back  

## 2020-04-01 NOTE — Telephone Encounter (Signed)
Returned call to patient no answer.LMTC. 

## 2020-04-01 NOTE — Telephone Encounter (Signed)
Patient returning call.

## 2020-04-02 ENCOUNTER — Telehealth: Payer: Self-pay | Admitting: Cardiovascular Disease

## 2020-04-02 NOTE — Telephone Encounter (Signed)
Spoke with Nilda Calamity. Records are in storage in [place] and have to be trucked over to them. Once received they will fax patient records to the office.

## 2020-04-02 NOTE — Telephone Encounter (Signed)
Spoke with pt, she read her office note via my chart and there were some things that were wrong. She reports in December when she had her incident of arm weakness, she was not in [place] but was local and was not seen at all. The 2015 documentation is correct, she was in [place] when she had her TIA and was seen. She also reports her mother dies of colon cancer not her father. Her father had heart disease. Will make dr Oval Linsey aware. Patient reports she has an appointment with the neuorologist 06-05-20.

## 2020-04-02 NOTE — Telephone Encounter (Signed)
Stephanie Luna is calling in regards to the medical records request their office received from Dr Oval Linsey and Rip Harbour. She is wanting to notify our office that Stephanie Luna has not seen them since 2016 so her records are in storage. She states they have submitted to receive the records, but it normally takes a few days. Please advise.

## 2020-04-09 ENCOUNTER — Telehealth: Payer: Self-pay | Admitting: Cardiovascular Disease

## 2020-04-09 MED ORDER — ROSUVASTATIN CALCIUM 40 MG PO TABS: 40.0000 mg | ORAL_TABLET | Freq: Every day | ORAL | 3 refills | Status: DC

## 2020-04-09 NOTE — Telephone Encounter (Signed)
Records received, placed in Dr Blenda Mounts folder to review

## 2020-04-09 NOTE — Telephone Encounter (Signed)
*  STAT* If patient is at the pharmacy, call can be transferred to refill team.   1. Which medications need to be refilled? (please list name of each medication and dose if known)  rosuvastatin (CRESTOR) 40 MG tablet [[id number]]    2. Which pharmacy/location (including street and city if local pharmacy) is medication to be sent to? [facility] .  This was call in to [place] and pt wanted it sent to her mail order.  She did not pick the order from [place]- Phone number    3. Do they need a 30 day or 90 day supply? [place]

## 2020-04-17 MED ORDER — ROSUVASTATIN CALCIUM 40 MG PO TABS: 40.0000 mg | ORAL_TABLET | Freq: Every day | ORAL | 3 refills | Status: DC

## 2020-04-17 NOTE — Telephone Encounter (Signed)
Patient calling back because the medication has not been sent in to [facility] yet.

## 2020-04-17 NOTE — Addendum Note (Signed)
Addended by: Patria Mane A on: 04/17/2020 03:23 PM   Modules accepted: Orders

## 2020-04-17 NOTE — Telephone Encounter (Signed)
rx sent to [facility] mail order.  Left message for patient to make aware.

## 2020-05-06 ENCOUNTER — Other Ambulatory Visit: Payer: Self-pay | Admitting: Family Medicine

## 2020-05-06 DIAGNOSIS — Z1231 Encounter for screening mammogram for malignant neoplasm of breast: Secondary | ICD-10-CM

## 2020-06-05 ENCOUNTER — Other Ambulatory Visit: Payer: Self-pay

## 2020-06-05 ENCOUNTER — Ambulatory Visit: Payer: Medicare HMO | Admitting: Neurology

## 2020-06-05 ENCOUNTER — Encounter: Payer: Self-pay | Admitting: Neurology

## 2020-06-05 VITALS — BP 118/76 | HR 80 | Ht 63.3 in | Wt 220.0 lb

## 2020-06-05 DIAGNOSIS — M5442 Lumbago with sciatica, left side: Secondary | ICD-10-CM | POA: Diagnosis not present

## 2020-06-05 DIAGNOSIS — G459 Transient cerebral ischemic attack, unspecified: Secondary | ICD-10-CM | POA: Diagnosis not present

## 2020-06-05 DIAGNOSIS — M5441 Lumbago with sciatica, right side: Secondary | ICD-10-CM | POA: Insufficient documentation

## 2020-06-05 MED ORDER — GABAPENTIN 300 MG PO CAPS: 300.0000 mg | ORAL_CAPSULE | Freq: Three times a day (TID) | ORAL | 11 refills | Status: DC

## 2020-06-05 NOTE — Progress Notes (Signed)
Chief Complaint  Patient presents with  . New Patient (Initial Visit)    Referral from TIA event on 03/18/21. Reports episode of right arm weakness, lasting several hours. Hx of CVA in 2015. She is taking Plavix 75mg  daily, in addition to her BP and cholesterol medications.       ASSESSMENT AND PLAN  Stephanie Luna is a 75 y.o. female   Acute onset of transient right arm weakness on March 18, 2020  Most suggestive of left posterior internal capsule TIA  Vascular risk factor of hypertension, hyperlipidemia, diabetes, previous history of stroke, obesity, history of smoke  Keep Plavix 75 mg daily  Complete evaluation with MRI of the brain, ultrasound of carotid artery, echocardiogram  Worsening low back pain, radiating pain to bilateral lower extremity  Suggestive of lumbar stenosis  MRI of lumbar  Gabapentin 300 mg 3 times daily  DIAGNOSTIC DATA (LABS, IMAGING, TESTING) - I reviewed patient records, labs, notes, testing and imaging myself where available.   HISTORICAL  Stephanie Luna is a 75 year old female seen in request by Dr. Oval Linsey, Jonelle Sidle, for evaluation of possible stroke, her primary care physician is Dr.Wolters, Ivin Booty, she is accompanied by her friend at today's visit June 05, 2020  I reviewed and summarized the referring note.PMHx. Stroke in 2015, right leg weakness,  Obesity. HTN HLD. DM since 2020 Kidney stone Hx of smoking 1/2 ppd, quit 2014.  She reported a TIA episode in 2015, was treated at [facility], she presented with sudden onset right leg weakness, lasting 5 minutes, per patient, MRI of the brain did show new stroke.  She has been treated with Plavix since.  On March 18, 2020, she was playing on her tablet, had a sudden onset right hand and arm heaviness, she has no control of the stylet, last about 3 hours, she denied language difficulty, denying right leg involvement  She is planning on to have left knee replacement surgery,  has chronic left knee pain, since 2021, also complains of worsening low back pain, radiating pain to bilateral hip, especially after bearing weight, can only walk short distance, multiple arthritic pain, also has significant right shoulder pain  I reviewed home record of blood pressure, 110 -130/90, glucose were around 100s PHYSICAL EXAM   Vitals:   06/05/20 1500  BP: 118/76  Pulse: 80  Weight: 220 lb (99.8 kg)  Height: 5' 3.3" (1.608 m)   Not recorded     Body mass index is 38.6 kg/m.  PHYSICAL EXAMNIATION:  Gen: NAD, conversant, well nourised, well groomed                     Cardiovascular: Regular rate rhythm, no peripheral edema, warm, nontender. Eyes: Conjunctivae clear without exudates or hemorrhage Neck: Supple, no carotid bruits. Pulmonary: Clear to auscultation bilaterally   NEUROLOGICAL EXAM:  MENTAL STATUS: Speech:    Speech is normal; fluent and spontaneous with normal comprehension.  Cognition:     Orientation to time, place and person     Normal recent and remote memory     Normal Attention span and concentration     Normal Language, naming, repeating,spontaneous speech     Fund of knowledge   CRANIAL NERVES: CN II: Visual fields are full to confrontation. Pupils are round equal and briskly reactive to light. CN III, IV, VI: extraocular movement are normal. No ptosis. CN V: Facial sensation is intact to light touch CN VII: Face is symmetric with normal eye closure  CN VIII: Hearing is normal to causal conversation. CN IX, X: Phonation is normal. CN XI: Head turning and shoulder shrug are intact  MOTOR: Limited right upper extremity proximal muscle examination due to right shoulder pain, felt to there was no significant bilateral upper or lower extremity weakness.  REFLEXES: Reflexes are 1 and symmetric at the biceps, triceps, knees, and absent at ankles. Plantar responses are flexor.  SENSORY: Mildly length dependent decreased light touch,  pinprick, vibratory sensation to ankle level  COORDINATION: There is no trunk or limb dysmetria noted.  GAIT/STANCE: Need push-up to get up from seated position, antalgic, cautious  REVIEW OF SYSTEMS: Full 14 system review of systems performed and notable only for as above All other review of systems were negative.  ALLERGIES: Allergies  Allergen Reactions  . Metformin And Related Other (See Comments)    Chest pain  . Codeine Nausea Only    HOME MEDICATIONS: Current Outpatient Medications  Medication Sig Dispense Refill  . acetaminophen (TYLENOL) 500 MG tablet Take 1000 mg by mouth 3 times daily    . Calcium Citrate-Vitamin D 315-250 MG-UNIT TABS Take 2 tablets by mouth once daily in the evening    . cholecalciferol (VITAMIN D) 1000 units tablet Take 1,000 Units by mouth every evening.    . clopidogrel (PLAVIX) 75 MG tablet Take 75 mg by mouth once daily    . losartan (COZAAR) 50 MG tablet Take 25 mg by mouth once daily    . metoprolol succinate (TOPROL-XL) 50 MG 24 hr tablet Take 75mg  by mouth once daily in the eveing    . Multiple Vitamin (MULTIVITAMIN) tablet Take 1 tablet by mouth daily.    . Multiple Vitamins-Minerals (EYE VITAMINS PO) Take 1 tablet by mouth 2 (two) times daily.    Marland Kitchen omeprazole (PRILOSEC) 40 MG capsule Take 40 mg by mouth once daily    . pioglitazone (ACTOS) 30 MG tablet Take 30 mg by mouth daily.    . rosuvastatin (CRESTOR) 40 MG tablet Take 1 tablet (40 mg total) by mouth daily. 90 tablet 3  . TRUE METRIX BLOOD GLUCOSE TEST test strip     . TRUEplus Lancets 28G MISC      No current facility-administered medications for this visit.    PAST MEDICAL HISTORY: Past Medical History:  Diagnosis Date  . CVA (cerebral vascular accident) ([place]) 2015   L parietal lobe, other ischemic infarcts seen, no occlusive vascular disease mentioned  . Diabetes mellitus without complication ([place])   . HLD (hyperlipidemia)   . Hypertension   . Kidney stones   . Right  bundle branch block (RBBB)   . TIA (transient ischemic attack)   . Tinnitus aurium, bilateral     PAST SURGICAL HISTORY: Past Surgical History:  Procedure Laterality Date  . ABDOMINAL HYSTERECTOMY    . CATARACT EXTRACTION, BILATERAL    . Colocystectomy     . exploratory laparotomy      x2 ; 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 ;   . MENISECTOMY Left age 78   subseqeuntly did revision to clean out the scar issue years later   . TONSILLECTOMY      FAMILY HISTORY: Family History  Problem Relation Age of Onset  . Colon cancer Mother   . Heart Problems Father   . Breast cancer Sister        diagnosed in her 55"s    SOCIAL HISTORY: Social History   Socioeconomic History  . Marital status: Widowed    Spouse  name: Not on file  . Number of children: 1  . Years of education: 42  . Highest education level: High school graduate  Occupational History  . Occupation: Retired  Tobacco Use  . Smoking status: Former Smoker    Quit date: 10/03/2013    Years since quitting: 6.6  . Smokeless tobacco: Never Used  Substance and Sexual Activity  . Alcohol use: No  . Drug use: Never  . Sexual activity: Not on file  Other Topics Concern  . Not on file  Social History Narrative   Lives at home with a roommate.   Right-handed.   Occasional soda.   Social Determinants of Health   Financial Resource Strain: Not on file  Food Insecurity: Not on file  Transportation Needs: Not on file  Physical Activity: Not on file  Stress: Not on file  Social Connections: Not on file  Intimate Partner Violence: Not on file      Marcial Pacas, M.D. Ph.D.  [facility] [address] [place], [place] [address] Ph: [phone] Fax: [phone]  CC:  Skeet Latch, [address],  [place] [address]  Jonathon Jordan, MD

## 2020-06-09 ENCOUNTER — Telehealth: Payer: Self-pay | Admitting: Neurology

## 2020-06-09 NOTE — Telephone Encounter (Signed)
Stephanie Luna: [id number] (exp. 06/09/20 to 07/09/20) order sent to GI. They will reach out to the patient to schedule.

## 2020-06-12 ENCOUNTER — Telehealth: Payer: Self-pay | Admitting: Neurology

## 2020-06-12 NOTE — Telephone Encounter (Signed)
06/12/2020  approved Echo Josem Kaufmann [id number]  Dates 06/10/2020-07/10/2020 [id number] Jayme Cloud

## 2020-06-23 ENCOUNTER — Ambulatory Visit
Admission: RE | Admit: 2020-06-23 | Discharge: 2020-06-23 | Disposition: A | Discharge status: Home / Self Care | Payer: Medicare HMO | Source: Ambulatory Visit | Attending: Neurology | Admitting: Neurology

## 2020-06-23 ENCOUNTER — Other Ambulatory Visit: Payer: Self-pay

## 2020-06-23 DIAGNOSIS — M5441 Lumbago with sciatica, right side: Secondary | ICD-10-CM | POA: Diagnosis not present

## 2020-06-23 DIAGNOSIS — G459 Transient cerebral ischemic attack, unspecified: Secondary | ICD-10-CM | POA: Diagnosis not present

## 2020-06-23 DIAGNOSIS — M545 Low back pain, unspecified: Secondary | ICD-10-CM | POA: Diagnosis not present

## 2020-06-23 DIAGNOSIS — M5442 Lumbago with sciatica, left side: Secondary | ICD-10-CM | POA: Diagnosis not present

## 2020-06-24 ENCOUNTER — Telehealth: Payer: Self-pay | Admitting: Neurology

## 2020-06-24 ENCOUNTER — Other Ambulatory Visit: Payer: Self-pay

## 2020-06-24 ENCOUNTER — Ambulatory Visit
Admission: RE | Admit: 2020-06-24 | Discharge: 2020-06-24 | Disposition: A | Discharge status: Home / Self Care | Payer: Medicare HMO | Source: Ambulatory Visit | Attending: Family Medicine | Admitting: Family Medicine

## 2020-06-24 DIAGNOSIS — Z1231 Encounter for screening mammogram for malignant neoplasm of breast: Secondary | ICD-10-CM

## 2020-06-24 NOTE — Telephone Encounter (Signed)
Left message requesting a return call.

## 2020-06-24 NOTE — Telephone Encounter (Signed)
  IMPRESSION: This MRI of the brain without contrast shows the following: 1.   No acute findings. 2.   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   3.   Scattered T2/FLAIR hyperintense foci in the hemispheres, pons and cerebellum most consistent with chronic microvascular ischemic change.  None of these appear to be acute. 4.   Mild age-appropriate generalized cortical atrophy.   IMPRESSION: This MRI of the lumbar spine without contrast shows the following: 1.   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   At L3-L4, there or degenerative changes causing mild spinal stenosis but no nerve root compression. 3.   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  There is moderate right foraminal narrowing but no definite nerve root compression. 4.   Milder degenerative changes at other lower thoracic and lumbar levels as detailed above that do not lead to spinal stenosis or nerve root compression.  Please call patient, MRI of brain showed evidence of old stroke, small vessel disease involving brainstem, supratentorial, generalized atrophy  MRI of lumbar spine showed evidence of compression fracture of L1, and T11  Multilevel degenerative changes, with no evidence of cord compression, moderate foraminal narrowing at right L5-S1,

## 2020-06-25 NOTE — Telephone Encounter (Signed)
Pt returned call. Please call back when available. 

## 2020-06-25 NOTE — Telephone Encounter (Signed)
I spoke to the patient and provided her with the MRI results. She will keep her pending appt for further review with Dr. Krista Blue on 07/22/20.

## 2020-06-27 ENCOUNTER — Other Ambulatory Visit: Payer: Self-pay | Admitting: Family Medicine

## 2020-06-27 DIAGNOSIS — R928 Other abnormal and inconclusive findings on diagnostic imaging of breast: Secondary | ICD-10-CM

## 2020-07-16 ENCOUNTER — Ambulatory Visit
Admission: RE | Admit: 2020-07-16 | Discharge: 2020-07-16 | Disposition: A | Discharge status: Home / Self Care | Payer: Medicare HMO | Source: Ambulatory Visit | Attending: Family Medicine | Admitting: Family Medicine

## 2020-07-16 ENCOUNTER — Other Ambulatory Visit: Payer: Self-pay

## 2020-07-16 DIAGNOSIS — R922 Inconclusive mammogram: Secondary | ICD-10-CM | POA: Diagnosis not present

## 2020-07-16 DIAGNOSIS — R928 Other abnormal and inconclusive findings on diagnostic imaging of breast: Secondary | ICD-10-CM

## 2020-07-21 ENCOUNTER — Ambulatory Visit (HOSPITAL_BASED_OUTPATIENT_CLINIC_OR_DEPARTMENT_OTHER)
Admission: RE | Admit: 2020-07-21 | Discharge: 2020-07-21 | Disposition: A | Discharge status: Home / Self Care | Payer: Medicare HMO | Source: Ambulatory Visit | Attending: Neurology | Admitting: Neurology

## 2020-07-21 ENCOUNTER — Other Ambulatory Visit: Payer: Self-pay

## 2020-07-21 ENCOUNTER — Ambulatory Visit (HOSPITAL_COMMUNITY)
Admission: RE | Admit: 2020-07-21 | Discharge: 2020-07-21 | Disposition: A | Discharge status: Home / Self Care | Payer: Medicare HMO | Source: Ambulatory Visit | Attending: Neurology | Admitting: Neurology

## 2020-07-21 DIAGNOSIS — I10 Essential (primary) hypertension: Secondary | ICD-10-CM | POA: Insufficient documentation

## 2020-07-21 DIAGNOSIS — E119 Type 2 diabetes mellitus without complications: Secondary | ICD-10-CM | POA: Diagnosis not present

## 2020-07-21 DIAGNOSIS — G459 Transient cerebral ischemic attack, unspecified: Secondary | ICD-10-CM | POA: Insufficient documentation

## 2020-07-21 LAB — ECHOCARDIOGRAM COMPLETE
Area-P 1/2: 3.08 cm2
Calc EF: 70.1 %
S' Lateral: 2.9 cm
Single Plane A2C EF: 63.9 %
Single Plane A4C EF: 76.6 %

## 2020-07-21 NOTE — Progress Notes (Signed)
  Echocardiogram 2D Echocardiogram has been performed.  Stephanie Luna 07/21/2020, 8:48 AM

## 2020-07-21 NOTE — Progress Notes (Signed)
VASCULAR LAB    Carotid duplex has been performed.  See CV proc for preliminary results.   Onika Gudiel, RVT 07/21/2020, 9:22 AM

## 2020-07-22 ENCOUNTER — Ambulatory Visit: Payer: Medicare HMO | Admitting: Neurology

## 2020-07-22 ENCOUNTER — Encounter: Payer: Self-pay | Admitting: Neurology

## 2020-07-22 VITALS — BP 122/71 | HR 66 | Ht 64.0 in | Wt 226.5 lb

## 2020-07-22 DIAGNOSIS — I6381 Other cerebral infarction due to occlusion or stenosis of small artery: Secondary | ICD-10-CM

## 2020-07-22 DIAGNOSIS — G459 Transient cerebral ischemic attack, unspecified: Secondary | ICD-10-CM | POA: Diagnosis not present

## 2020-07-22 DIAGNOSIS — R269 Unspecified abnormalities of gait and mobility: Secondary | ICD-10-CM | POA: Diagnosis not present

## 2020-07-22 NOTE — Progress Notes (Signed)
Chief Complaint  Patient presents with  . Follow-up    Here with her friend, Beverlee Nims. Follow up TIA/gait abnormality. She has continued Plavix 75mg  daily. She would like to review her test results (MRI brain, MRI lumbar, US carotid, ECHO). Feels gabapentin 300mg , one cap TID is only providing minimal relief.      ASSESSMENT AND PLAN  Stephanie Luna is a 75 y.o. female   Acute onset of transient right arm weakness on March 18, 2020  Most suggestive of left posterior internal capsule TIA  Vascular risk factor of hypertension, hyperlipidemia, diabetes, previous history of stroke, obesity, history of smoke  Keep Plavix 75 mg daily   MRI of the brain, ultrasound of carotid artery, echocardiogram  Worsening low back pain, radiating pain to bilateral lower extremity  MRI of lumbar showed multilevel degenerative changes, evidence of L1 chronic compression fracture, there was no significant canal or foraminal narrowing  Continue warm compression, stretching, if has worsening pain may consider pain management refer  Gait abnormality  No significant muscle weakness, limitation is mainly due to her significant left knee pain, low back pain, hope to improve with planned left knee replacement  DIAGNOSTIC DATA (LABS, IMAGING, TESTING) - I reviewed patient records, labs, notes, testing and imaging myself where available.  MRI of the brain in June 23, 2020, 1.   No acute findings. 2.   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   3.   Scattered T2/FLAIR hyperintense foci in the hemispheres, pons and cerebellum most consistent with chronic microvascular ischemic change.  None of these appear to be acute. 4.   Mild age-appropriate generalized cortical atrophy.   MRI lumbar: 1.   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   At L3-L4, there or degenerative changes causing mild spinal stenosis but no nerve root  compression. 3.   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  There is moderate right foraminal narrowing but no definite nerve root compression. 4.   Milder degenerative changes at other lower thoracic and lumbar levels as detailed above that do not lead to spinal stenosis or nerve root compression.  HISTORICAL  Stephanie Luna is a 75 year old female seen in request by Stephanie Luna, for evaluation of possible stroke, her primary care physician is Stephanie Luna, she is accompanied by her friend at today's visit June 05, 2020  I reviewed and summarized the referring note.PMHx. Stroke in 2015, right leg weakness,  Obesity. HTN HLD. DM since 2020 Kidney stone Hx of smoking 1/2 ppd, quit 2014.  She reported a TIA episode in 2015, was treated at [facility] [place], she presented with sudden onset right leg weakness, lasting 5 minutes, per patient, MRI of the brain did show new stroke.  She has been treated with Plavix since.  On March 18, 2020, she was playing on her tablet, had a sudden onset right hand and arm heaviness, she has no control of the stylet, last about 3 hours, she denied language difficulty, denying right leg involvement  She is planning on to have left knee replacement surgery, has chronic left knee pain, since 2021, also complains of worsening low back pain, radiating pain to bilateral hip, especially after bearing weight, can only walk short distance, multiple arthritic pain, also has significant right shoulder pain  UPDATE Jul 22 2020: She is accompanied by her friends at today's clinical visit, continue complains of significant left knee  pain, gabapentin has helped her sleep better, but did not help her left knee pain  Personally reviewed MRI of the brain, no acute stroke, evidence of moderate supratentorium small vessel disease left cerebellar lacunar infarction  MRI of lumbar also showed multilevel degenerative changes,  chronic compression fracture of L1 with 50 to 60% compression, no significant canal stenosis, variable degree of foraminal narrowing, no evidence of spinal cord or nerve root compression  She continue to complain significant low back pain, mostly at midline, occasional radiating pain to right leg, gait abnormality  Echocardiogram showed no significant abnormality Ultrasound of carotid artery showed no large vessel disease  PHYSICAL EXAM   Vitals:   07/22/20 1303  BP: 122/71  Pulse: 66  Weight: 226 lb 8 oz (102.7 kg)  Height: 5\' 4"  (1.626 m)   Not recorded     Body mass index is 38.88 kg/m.  PHYSICAL EXAMNIATION:  Gen: NAD, conversant, well nourised, well groomed                     Cardiovascular: Regular rate rhythm, no peripheral edema, warm, nontender. Eyes: Conjunctivae clear without exudates or hemorrhage Neck: Supple, no carotid bruits. Pulmonary: Clear to auscultation bilaterally   NEUROLOGICAL EXAM:  MENTAL STATUS: Speech:    Speech is normal; fluent and spontaneous with normal comprehension.  Cognition:     Orientation to time, place and person     Normal recent and remote memory     Normal Attention span and concentration     Normal Language, naming, repeating,spontaneous speech     Fund of knowledge   CRANIAL NERVES: CN II: Visual fields are full to confrontation. Pupils are round equal and briskly reactive to light. CN III, IV, VI: extraocular movement are normal. No ptosis. CN V: Facial sensation is intact to light touch CN VII: Face is symmetric with normal eye closure  CN VIII: Hearing is normal to causal conversation. CN IX, X: Phonation is normal. CN XI: Head turning and shoulder shrug are intact  MOTOR: Limited right upper extremity proximal muscle examination due to right shoulder pain, felt to there was no significant bilateral upper or lower extremity weakness.  REFLEXES: Reflexes are 1 and symmetric at the biceps, triceps, knees, and  absent at ankles. Plantar responses are flexor.  SENSORY: Mildly length dependent decreased light touch, pinprick, vibratory sensation to ankle level  COORDINATION: There is no trunk or limb dysmetria noted.  GAIT/STANCE: Need push-up to get up from seated position, antalgic, cautious  REVIEW OF SYSTEMS: Full 14 system review of systems performed and notable only for as above All other review of systems were negative.  ALLERGIES: Allergies  Allergen Reactions  . Metformin And Related Other (See Comments)    Chest pain  . Codeine Nausea Only    HOME MEDICATIONS: Current Outpatient Medications  Medication Sig Dispense Refill  . acetaminophen (TYLENOL) 500 MG tablet Take 1000 mg by mouth 3 times daily    . Calcium Citrate-Vitamin D 315-250 MG-UNIT TABS Take 2 tablets by mouth once daily in the evening    . cholecalciferol (VITAMIN D) 1000 units tablet Take 1,000 Units by mouth every evening.    . clopidogrel (PLAVIX) 75 MG tablet Take 75 mg by mouth once daily    . gabapentin (NEURONTIN) 300 MG capsule Take 1 capsule (300 mg total) by mouth 3 (three) times daily. 90 capsule 11  . losartan (COZAAR) 50 MG tablet Take 25 mg by mouth once daily    .  metoprolol succinate (TOPROL-XL) 50 MG 24 hr tablet Take 75mg  by mouth once daily in the eveing    . Multiple Vitamin (MULTIVITAMIN) tablet Take 1 tablet by mouth daily.    . Multiple Vitamins-Minerals (EYE VITAMINS PO) Take 1 tablet by mouth 2 (two) times daily.    Marland Kitchen omeprazole (PRILOSEC) 40 MG capsule Take 40 mg by mouth once daily    . pioglitazone (ACTOS) 30 MG tablet Take 30 mg by mouth daily.    . TRUE METRIX BLOOD GLUCOSE TEST test strip     . TRUEplus Lancets 28G MISC     . rosuvastatin (CRESTOR) 40 MG tablet Take 1 tablet (40 mg total) by mouth daily. 90 tablet 3   No current facility-administered medications for this visit.    PAST MEDICAL HISTORY: Past Medical History:  Diagnosis Date  . CVA (cerebral vascular accident)  ([place]) 2015   L parietal lobe, other ischemic infarcts seen, no occlusive vascular disease mentioned  . Diabetes mellitus without complication ([place])   . HLD (hyperlipidemia)   . Hypertension   . Kidney stones   . Right bundle branch block (RBBB)   . TIA (transient ischemic attack)   . Tinnitus aurium, bilateral     PAST SURGICAL HISTORY: Past Surgical History:  Procedure Laterality Date  . ABDOMINAL HYSTERECTOMY    . CATARACT EXTRACTION, BILATERAL    . Colocystectomy     . exploratory laparotomy      x2 ; 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 ;   . MENISECTOMY Left age 46   subseqeuntly did revision to clean out the scar issue years later   . TONSILLECTOMY      FAMILY HISTORY: Family History  Problem Relation Age of Onset  . Colon cancer Mother   . Heart Problems Father   . Breast cancer Sister        diagnosed in her 23"s    SOCIAL HISTORY: Social History   Socioeconomic History  . Marital status: Widowed    Spouse name: Not on file  . Number of children: 1  . Years of education: 45  . Highest education level: High school graduate  Occupational History  . Occupation: Retired  Tobacco Use  . Smoking status: Former Smoker    Quit date: 10/03/2013    Years since quitting: 6.8  . Smokeless tobacco: Never Used  Substance and Sexual Activity  . Alcohol use: No  . Drug use: Never  . Sexual activity: Not on file  Other Topics Concern  . Not on file  Social History Narrative   Lives at home with a roommate.   Right-handed.   Occasional soda.   Social Determinants of Health   Financial Resource Strain: Not on file  Food Insecurity: Not on file  Transportation Needs: Not on file  Physical Activity: Not on file  Stress: Not on file  Social Connections: Not on file  Intimate Partner Violence: Not on file      Marcial Pacas, M.D. Ph.D.  [facility] [address], [place], [place] [address] Ph: [phone] Fax: [phone]  CC:   Jonathon Jordan, MD [address],  [place] [address]  Jonathon Jordan, MD

## 2020-08-14 DIAGNOSIS — Z79899 Other long term (current) drug therapy: Secondary | ICD-10-CM | POA: Diagnosis not present

## 2020-08-14 DIAGNOSIS — E1169 Type 2 diabetes mellitus with other specified complication: Secondary | ICD-10-CM | POA: Diagnosis not present

## 2020-08-14 DIAGNOSIS — E559 Vitamin D deficiency, unspecified: Secondary | ICD-10-CM | POA: Diagnosis not present

## 2020-08-14 DIAGNOSIS — E785 Hyperlipidemia, unspecified: Secondary | ICD-10-CM | POA: Diagnosis not present

## 2020-08-20 DIAGNOSIS — E559 Vitamin D deficiency, unspecified: Secondary | ICD-10-CM | POA: Diagnosis not present

## 2020-08-20 DIAGNOSIS — Z0000 Encounter for general adult medical examination without abnormal findings: Secondary | ICD-10-CM | POA: Diagnosis not present

## 2020-08-20 DIAGNOSIS — E1169 Type 2 diabetes mellitus with other specified complication: Secondary | ICD-10-CM | POA: Diagnosis not present

## 2020-08-20 DIAGNOSIS — E785 Hyperlipidemia, unspecified: Secondary | ICD-10-CM | POA: Diagnosis not present

## 2020-08-20 DIAGNOSIS — Z6841 Body Mass Index (BMI) 40.0 and over, adult: Secondary | ICD-10-CM | POA: Diagnosis not present

## 2020-08-20 DIAGNOSIS — G459 Transient cerebral ischemic attack, unspecified: Secondary | ICD-10-CM | POA: Diagnosis not present

## 2020-08-20 DIAGNOSIS — I4510 Unspecified right bundle-branch block: Secondary | ICD-10-CM | POA: Diagnosis not present

## 2020-08-20 DIAGNOSIS — E1140 Type 2 diabetes mellitus with diabetic neuropathy, unspecified: Secondary | ICD-10-CM | POA: Diagnosis not present

## 2020-08-20 DIAGNOSIS — Z79899 Other long term (current) drug therapy: Secondary | ICD-10-CM | POA: Diagnosis not present

## 2020-08-20 DIAGNOSIS — I10 Essential (primary) hypertension: Secondary | ICD-10-CM | POA: Diagnosis not present

## 2020-11-25 DIAGNOSIS — Z23 Encounter for immunization: Secondary | ICD-10-CM | POA: Diagnosis not present

## 2021-01-29 DIAGNOSIS — E119 Type 2 diabetes mellitus without complications: Secondary | ICD-10-CM | POA: Diagnosis not present

## 2021-01-29 DIAGNOSIS — H26493 Other secondary cataract, bilateral: Secondary | ICD-10-CM | POA: Diagnosis not present

## 2021-01-29 DIAGNOSIS — Z961 Presence of intraocular lens: Secondary | ICD-10-CM | POA: Diagnosis not present

## 2021-01-29 DIAGNOSIS — H04123 Dry eye syndrome of bilateral lacrimal glands: Secondary | ICD-10-CM | POA: Diagnosis not present

## 2021-02-09 ENCOUNTER — Other Ambulatory Visit: Payer: Self-pay | Admitting: Cardiovascular Disease

## 2021-02-09 NOTE — Telephone Encounter (Signed)
Rx request sent to pharmacy.  

## 2021-02-28 DIAGNOSIS — U071 COVID-19: Secondary | ICD-10-CM | POA: Diagnosis not present

## 2021-04-28 DIAGNOSIS — M25572 Pain in left ankle and joints of left foot: Secondary | ICD-10-CM | POA: Diagnosis not present

## 2021-06-08 ENCOUNTER — Other Ambulatory Visit: Payer: Self-pay | Admitting: Family Medicine

## 2021-06-08 DIAGNOSIS — Z1231 Encounter for screening mammogram for malignant neoplasm of breast: Secondary | ICD-10-CM

## 2021-06-25 ENCOUNTER — Ambulatory Visit
Admission: RE | Admit: 2021-06-25 | Discharge: 2021-06-25 | Disposition: A | Discharge status: Home / Self Care | Payer: Medicare HMO | Source: Ambulatory Visit | Attending: Family Medicine | Admitting: Family Medicine

## 2021-06-25 DIAGNOSIS — Z1231 Encounter for screening mammogram for malignant neoplasm of breast: Secondary | ICD-10-CM

## 2021-07-03 DIAGNOSIS — K649 Unspecified hemorrhoids: Secondary | ICD-10-CM | POA: Diagnosis not present

## 2021-07-03 DIAGNOSIS — R198 Other specified symptoms and signs involving the digestive system and abdomen: Secondary | ICD-10-CM | POA: Diagnosis not present

## 2021-07-03 DIAGNOSIS — Z86010 Personal history of colonic polyps: Secondary | ICD-10-CM | POA: Diagnosis not present

## 2021-07-09 NOTE — Progress Notes (Signed)
? ?Cardiology Office Note  ? ?Date:  07/13/2021  ? ?ID:  Stephanie Luna, DOB [birth year], MRN [id number] ? ?PCP:  Stephanie Jordan, MD  ?Cardiologist:  Stephanie Latch, MD  ?Electrophysiologist:  None  ? ?Evaluation Performed:  Follow-Up Visit ? ?Chief Complaint:  TIA ? ?History of Present Illness:   ? ?Stephanie Luna is a 76 y.o. female with stroke, diabetes, RBBB, hypertension, ascending aortic dilation, and hyperlipidemia who presents for follow up.  Stephanie Luna first established care 10/2015. She had a LHC in 2012 that was reportedly negative for obstructive coronary disease. She notes occasional episodes of chest pain that she attributes to her hiatal hernia. Stephanie Luna had an episode of R arm weakness on 12/28.  She was travelling in [place] at the time.  She didn't have good control of her arm.  The episode lasted for several hours.  There was no associated pain, slurred speech, face drooping or aphasia. With her TIA in 2015 her symptoms were profound dizziness and leg heaviness.  She went to an outside hospital and head CT reportedly showed prior stroke but nothing acute.  She was discharged and instructed to follow up with her PCP and a local cardiologist. At her last appointment, she was doing well with no residual symptoms. Her simvastatin was switched to 40 mg rosuvastatin. ? ?She was seen by neurology 07/2020 and they recommended continuing Plavix. Carotid dopplers showed minimal obstruction. Echo revealed LVEF 55/60% with mild LVH and grade 1 diastolic dysfunction. Her ascending aorta was moderating dilated at 4.6 cm. Today, she is doing well and accompanied by a family member. She notices her blood pressure at home has been elevated. However, her blood pressure in the office is normal. She typically checks her blood in the pressure in the morning and admits to not resting prior to taking her pressure. She checks her blood pressure prior to taking her medications. Daily, she walks her dog and  around her house. Her arthritis, especially in her L knee, can cause her to pause and take a break. She is able to walk 1/2 a mile before her knee pain bothers her. She endorses occasion leg swelling but she enjoys food high in salt. Recently, the swelling has been worse and she notices her bilateral hands swelling. She sleeps in her recliner because is is uncomfortable when laying flat. 2 of her siblings have aortic aneurysm. She is scheduled to undergo a colonoscopy. She denies any palpitations, lightheadedness, headaches, syncope, orthopnea, PND.Marland Kitchen ? ?Past Medical History:  ?Diagnosis Date  ? 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 CVA (cerebral vascular accident) ([place]) 2015  ? L parietal lobe, other ischemic infarcts seen, no occlusive vascular disease mentioned  ? Diabetes mellitus without complication ([place])   ? HLD (hyperlipidemia)   ? Hypertension   ? Kidney stones   ? Right bundle branch block (RBBB)   ? TIA (transient ischemic attack)   ? Tinnitus aurium, bilateral   ? ?Past Surgical History:  ?Procedure Laterality Date  ? ABDOMINAL HYSTERECTOMY    ? CATARACT EXTRACTION, BILATERAL    ? Colocystectomy     ? exploratory laparotomy     ? 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 MENISECTOMY Left age 73  ? subseqeuntly did revision to clean out the scar issue years later   ? TONSILLECTOMY    ?  ? ?Current Meds  ?Medication Sig  ? acetaminophen (TYLENOL) 500 MG tablet Take 1000 mg by mouth 3  times daily  ? Calcium Citrate-Vitamin D 315-250 MG-UNIT TABS Take 2 tablets by mouth once daily in the evening  ? cholecalciferol (VITAMIN D) 1000 units tablet Take 1,000 Units by mouth every evening.  ? clopidogrel (PLAVIX) 75 MG tablet Take 75 mg by mouth once daily  ? losartan (COZAAR) 50 MG tablet Take 25 mg by mouth once daily  ? metoprolol succinate (TOPROL-XL) 50 MG 24 hr tablet Take '75mg'$  by mouth once daily in the eveing  ? Multiple Vitamin (MULTIVITAMIN) tablet Take 1 tablet by mouth daily.  ? Multiple  Vitamins-Minerals (EYE VITAMINS PO) Take 1 tablet by mouth 2 (two) times daily.  ? omeprazole (PRILOSEC) 40 MG capsule Take 40 mg by mouth once daily  ? pioglitazone (ACTOS) 30 MG tablet Take 30 mg by mouth daily.  ? rosuvastatin (CRESTOR) 40 MG tablet TAKE 1 TABLET (40 MG TOTAL) BY MOUTH DAILY (STOP SIMVASTATIN)  ? TRUE METRIX BLOOD GLUCOSE TEST test strip   ? TRUEplus Lancets 28G MISC   ?  ? ?Allergies:   Metformin and related and Codeine  ? ?Social History  ? ?Tobacco Use  ? Smoking status: Former  ?  Types: Cigarettes  ?  Quit date: 10/03/2013  ?  Years since quitting: 7.7  ? Smokeless tobacco: Never  ?Substance Use Topics  ? Alcohol use: No  ? Drug use: Never  ?  ? ?Family Hx: ?The patient's family history includes Breast cancer in her sister; Colon cancer in her mother; Heart Problems in her father. ? ?ROS:   ?Please see the history of present illness.    ?(+) Bilateral knee pain ?(+) LE edema (L>R) ?(+) Bilateral hand edema ?All other systems reviewed and are negative. ? ?Prior CV studies:   ?The following studies were reviewed today: ? ?Carotid Duplex 07/21/20 ?Summary:  ?Right Carotid: The extracranial vessels were near-normal with only minimal  ?wall  ?               thickening or plaque.  ? ?Left Carotid: The extracranial vessels were near-normal with only minimal  ?wall  ?              thickening or plaque.  ? ?Vertebrals:  Bilateral vertebral arteries demonstrate antegrade flow.  ?Subclavians: Normal flow hemodynamics were seen in bilateral subclavian  ?             arteries.  ? ?Echo 07/21/20 ? 1. Left ventricular ejection fraction, by estimation, is 55 to 60%. The  ?left ventricle has normal function. The left ventricle has no regional  ?wall motion abnormalities. There is mild left ventricular hypertrophy.  ?Left ventricular diastolic parameters  ?are consistent with Grade I diastolic dysfunction (impaired relaxation).  ?The average left ventricular global longitudinal strain is 25.5 %. The  ?global  longitudinal strain is normal.  ? 2. Right ventricular systolic function is normal. The right ventricular  ?size is normal. There is mildly elevated pulmonary artery systolic  ?pressure.  ? 3. The mitral valve is normal in structure. Mild mitral valve  ?regurgitation. No evidence of mitral stenosis.  ? 4. The aortic valve is tricuspid. Aortic valve regurgitation is not  ?visualized. Mild aortic valve sclerosis is present, with no evidence of  ?aortic valve stenosis.  ? 5. Aortic dilatation noted. There is mild dilatation of the aortic root,  ?measuring 40 mm. There is moderate dilatation of the ascending aorta,  ?measuring 46 mm.  ? 6. The inferior vena cava is dilated in size with >50%  respiratory  ?variability, suggesting right atrial pressure of 8 mmHg.  ? ?Echo 08/29/13: LVEF greater than 70%. Mild LVH. Diastolic dysfunction ? ?Nuclear stress 11/13/15: LVEF 85%.  No ischemia ?Carotid Doppler 11/13/15: Mild bilateral ICA stenosis ? ?Labs/Other Tests and Data Reviewed:   ? ?EKG:  ?07/13/21: Sinus rhythm rate 83; RBBB, prior anterior infarct ?03/27/20: sinus rhythm.  Rate 67 bpm.  RBBB.  Low voltage.  Prior inferior infarct.   ? ?Recent Labs: ?No results found for requested labs within last 8760 hours.  ? ?Recent Lipid Panel ?No results found for: CHOL, TRIG, HDL, CHOLHDL, LDLCALC, LDLDIRECT ? ?Wt Readings from Last 3 Encounters:  ?07/13/21 236 lb 14.4 oz (107.5 kg)  ?07/22/20 226 lb 8 oz (102.7 kg)  ?06/05/20 220 lb (99.8 kg)  ?  ? ?Objective:   ? ?VS:  BP 112/64 (BP Location: Left Arm, Patient Position: Sitting, Cuff Size: Large)   Pulse 83   Ht '5\' 4"'$  (1.626 m)   Wt 236 lb 14.4 oz (107.5 kg)   BMI 40.66 kg/m?  , BMI Body mass index is 40.66 kg/m?. ?GENERAL:  Well appearing ?HEENT: Pupils equal round and reactive, fundi not visualized, oral mucosa unremarkable ?NECK:  No jugular venous distention, waveform within normal limits, carotid upstroke brisk and symmetric, no bruits, no thyromegaly ?LUNGS:  Clear to  auscultation bilaterally ?HEART:  RRR.  PMI not displaced or sustained,S1 and S2 within normal limits, no S3, no S4, no clicks, no rubs, no murmurs ?ABD:  Flat, positive bowel sounds normal in frequency in pitch, no bruits, no

## 2021-07-13 ENCOUNTER — Ambulatory Visit (HOSPITAL_BASED_OUTPATIENT_CLINIC_OR_DEPARTMENT_OTHER): Payer: Medicare HMO | Admitting: Cardiovascular Disease

## 2021-07-13 ENCOUNTER — Encounter (HOSPITAL_BASED_OUTPATIENT_CLINIC_OR_DEPARTMENT_OTHER): Payer: Self-pay | Admitting: Cardiovascular Disease

## 2021-07-13 DIAGNOSIS — I6381 Other cerebral infarction due to occlusion or stenosis of small artery: Secondary | ICD-10-CM

## 2021-07-13 DIAGNOSIS — I10 Essential (primary) hypertension: Secondary | ICD-10-CM | POA: Diagnosis not present

## 2021-07-13 DIAGNOSIS — Z6841 Body Mass Index (BMI) 40.0 and over, adult: Secondary | ICD-10-CM | POA: Diagnosis not present

## 2021-07-13 DIAGNOSIS — I7121 Aneurysm of the ascending aorta, without rupture: Secondary | ICD-10-CM | POA: Insufficient documentation

## 2021-07-13 HISTORY — DX: Aneurysm of the ascending aorta, without rupture: I71.21

## 2021-07-13 NOTE — Assessment & Plan Note (Signed)
Symptoms resolved.  Continue clopidogrel and rosuvastatin.  OK to hold Plavix for procedures. ?

## 2021-07-13 NOTE — Patient Instructions (Signed)
Medication Instructions:  ?Your physician recommends that you continue on your current medications as directed. Please refer to the Current Medication list given to you today.  ? ?*If you need a refill on your cardiac medications before your next appointment, please call your pharmacy* ? ?Lab Work: ?NONE  ? ?Testing/Procedures: ?Your physician has requested that you have an echocardiogram. Echocardiography is a painless test that uses sound waves to create images of your heart. It provides your doctor with information about the size and shape of your heart and how well your heart?s chambers and valves are working. This procedure takes approximately one hour. There are no restrictions for this procedure.  ?SOON AND REPEAT IN 1 YEAR  ? ?Follow-Up: ?At [facility], you and your health needs are our priority.  As part of our continuing mission to provide you with exceptional heart care, we have created designated Provider Care Teams.  These Care Teams include your primary Cardiologist (physician) and Advanced Practice Providers (APPs -  Physician Assistants and Nurse Practitioners) who all work together to provide you with the care you need, when you need it. ? ?We recommend signing up for the patient portal called "MyChart".  Sign up information is provided on this After Visit Summary.  MyChart is used to connect with patients for Virtual Visits (Telemedicine).  Patients are able to view lab/test results, encounter notes, upcoming appointments, etc.  Non-urgent messages can be sent to your provider as well.   ?To learn more about what you can do with MyChart, go to [email or web address].   ? ?Your next appointment:   ?12 month(s) ? ?The format for your next appointment:   ?In Person ? ?Provider:   ?Skeet Latch, MD AFTER REPEAT ECHO IN 1 YEAR   ? ? ? ? ? ?

## 2021-07-13 NOTE — Assessment & Plan Note (Addendum)
Moderate ascending aorta aneurysm 4.6 cm on echo 07/2020.  She reports that two siblings also have aortic aneurysms.  None have ruptured.  Continue metoprolol.   BP control and repeat echo now and in one year.  ?

## 2021-07-13 NOTE — Assessment & Plan Note (Signed)
Blood pressure it well-controlled here and with her PCP.  Her BP at home has been 116-142/80-90s.  However she takes it first thing in the AM and right before taking her medication.  We will not make any changes at this time.  Continue losartan and metoprolol.  ?

## 2021-07-16 ENCOUNTER — Ambulatory Visit: Payer: Medicare HMO | Admitting: Neurology

## 2021-07-16 VITALS — BP 111/74 | HR 74 | Ht 64.0 in | Wt 237.0 lb

## 2021-07-16 DIAGNOSIS — M5442 Lumbago with sciatica, left side: Secondary | ICD-10-CM

## 2021-07-16 DIAGNOSIS — I6381 Other cerebral infarction due to occlusion or stenosis of small artery: Secondary | ICD-10-CM

## 2021-07-16 DIAGNOSIS — Z8673 Personal history of transient ischemic attack (TIA), and cerebral infarction without residual deficits: Secondary | ICD-10-CM | POA: Diagnosis not present

## 2021-07-16 DIAGNOSIS — M5441 Lumbago with sciatica, right side: Secondary | ICD-10-CM

## 2021-07-16 NOTE — Progress Notes (Signed)
? ? ?Patient: Stephanie Luna ?Date of Birth: December 22, 1945 ? ?Reason for Visit: Follow up ?History from: Patient, friend ?Primary Neurologist: Dr. Krista Blue  ? ?ASSESSMENT AND PLAN ?76 y.o. year old female  ? ?1.  Acute onset transient right arm weakness December 2021 ?-MRI brain in April 2022 showed lacunar infarction in the left cerebellar hemisphere ?-On Plavix 75 mg daily ?-Continue work with PCP for management of vascular risk factors ? ?2.  Worsening low back pain, radiating pain to bilateral lower extremities ?-MRI lumbar spine has shown multilevel degenerative changes, evidence of L1 chronic compression fracture no significant canal or foraminal narrowing ?-Has decided to stop gabapentin, did not see much benefit, we discussed referral to pain clinic, for now wishes to hold off, is hoping to have an injection to the left knee ?-Follow-up at our office on an as-needed basis ? ?HISTORY  ?Stephanie Luna is a 76 year old female seen in request by Dr. Oval Linsey, Jonelle Sidle, for evaluation of possible stroke, her primary care physician is Dr.Wolters, Ivin Booty, she is accompanied by her friend at today's visit June 05, 2020 ?  ?I reviewed and summarized the referring note.PMHx. ?Stroke in 2015, right leg weakness,  ?Obesity. ?HTN ?HLD. ?DM since 2020 ?Kidney stone ?Hx of smoking 1/2 ppd, quit 2014. ?  ?She reported a TIA episode in 2015, was treated at [place], she presented with sudden onset right leg weakness, lasting 5 minutes, per patient, MRI of the brain did show new stroke.  She has been treated with Plavix since. ?  ?On March 18, 2020, she was playing on her tablet, had a sudden onset right hand and arm heaviness, she has no control of the stylet, last about 3 hours, she denied language difficulty, denying right leg involvement ?  ?She is planning on to have left knee replacement surgery, has chronic left knee pain, since 2021, also complains of worsening low back pain, radiating pain to bilateral hip,  especially after bearing weight, can only walk short distance, multiple arthritic pain, also has significant right shoulder pain ?  ?UPDATE Jul 22 2020: ?She is accompanied by her friends at today's clinical visit, continue complains of significant left knee pain, gabapentin has helped her sleep better, but did not help her left knee pain ?  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  ?MRI of lumbar also showed multilevel degenerative changes, chronic compression fracture of L1 with 50 to 60% compression, no significant canal stenosis, variable degree of foraminal narrowing, no evidence of spinal cord or nerve root compression ?  ?She continue to complain significant low back pain, mostly at midline, occasional radiating pain to right leg, gait abnormality ?  ?Echocardiogram showed no significant abnormality ?Ultrasound of carotid artery showed no large vessel disease ? ?Update July 16, 2021 SS: Here today with friend, Peter Congo and roommate. On gabapentin 300 mg 3 times daily, take 1 pill daily for the last 5 days. Is going to stop it, didn't feel like it was helping, has general pain all over, orthopedic issues, back pain. Seeing about left knee injection, needs knee replacement. Sees cardiology, planning to have repeat echo, has moderate ascending aorta aneurysm. Does daily walking, takes it slow, rests in between, quarter of mile distance. Wants to hold off for pain clinic until decision about knee injection. ? ?REVIEW OF SYSTEMS: Out of a complete 14 system review of symptoms, the patient complains only of the following symptoms,  and all other reviewed systems are negative. ? ?See HPI ? ?ALLERGIES: ?Allergies  ?Allergen Reactions  ? Metformin And Related Other (See Comments)  ?  Chest pain  ? Codeine Nausea Only  ? ? ?HOME MEDICATIONS: ?Outpatient Medications Prior to Visit  ?Medication Sig Dispense Refill  ?  acetaminophen (TYLENOL) 500 MG tablet Take 1000 mg by mouth 3 times daily    ? Calcium Citrate-Vitamin D 315-250 MG-UNIT TABS Take 2 tablets by mouth once daily in the evening    ? cholecalciferol (VITAMIN D) 1000 units tablet Take 1,000 Units by mouth every evening.    ? clopidogrel (PLAVIX) 75 MG tablet Take 75 mg by mouth once daily    ? losartan (COZAAR) 50 MG tablet Take 25 mg by mouth once daily    ? metoprolol succinate (TOPROL-XL) 50 MG 24 hr tablet Take '75mg'$  by mouth once daily in the eveing    ? Multiple Vitamin (MULTIVITAMIN) tablet Take 1 tablet by mouth daily.    ? Multiple Vitamins-Minerals (EYE VITAMINS PO) Take 1 tablet by mouth 2 (two) times daily.    ? omeprazole (PRILOSEC) 40 MG capsule Take 40 mg by mouth once daily    ? pioglitazone (ACTOS) 30 MG tablet Take 30 mg by mouth daily.    ? rosuvastatin (CRESTOR) 40 MG tablet TAKE 1 TABLET (40 MG TOTAL) BY MOUTH DAILY (STOP SIMVASTATIN) 90 tablet 1  ? TRUE METRIX BLOOD GLUCOSE TEST test strip     ? TRUEplus Lancets 28G MISC     ? ?No facility-administered medications prior to visit.  ? ? ?PAST MEDICAL HISTORY: ?Past Medical History:  ?Diagnosis Date  ? 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 CVA (cerebral vascular accident) ([place]) 2015  ? L parietal lobe, other ischemic infarcts seen, no occlusive vascular disease mentioned  ? Diabetes mellitus without complication ([place])   ? HLD (hyperlipidemia)   ? Hypertension   ? Kidney stones   ? Right bundle branch block (RBBB)   ? TIA (transient ischemic attack)   ? Tinnitus aurium, bilateral   ? ? ?PAST SURGICAL HISTORY: ?Past Surgical History:  ?Procedure Laterality Date  ? ABDOMINAL HYSTERECTOMY    ? CATARACT EXTRACTION, BILATERAL    ? Colocystectomy     ? exploratory laparotomy     ? 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 MENISECTOMY Left age 32  ? subseqeuntly did revision to clean out the scar issue years later   ? TONSILLECTOMY    ? ? ?FAMILY HISTORY: ?Family History  ?Problem Relation Age of  Onset  ? Colon cancer Mother   ? Heart Problems Father   ? Breast cancer Sister   ?     diagnosed in her 70"s  ? ? ?SOCIAL HISTORY: ?Social History  ? ?Socioeconomic History  ? Marital status: Widowed  ?  Spouse name: Not on file  ? Number of children: 1  ? Years of education: 13  ? Highest education level: High school graduate  ?Occupational History  ? Occupation: Retired  ?Tobacco Use  ? Smoking status: Former  ?  Types: Cigarettes  ?  Quit date: 10/03/2013  ?  Years since quitting: 7.7  ? Smokeless tobacco: Never  ?Substance and Sexual Activity  ? Alcohol use: No  ? Drug use: Never  ? Sexual activity: Not on file  ?Other Topics Concern  ? Not on file  ?Social History Narrative  ? Lives at home with a roommate.  ? Right-handed.  ?  Occasional soda.  ? ?Social Determinants of Health  ? ?Financial Resource Strain: Not on file  ?Food Insecurity: Not on file  ?Transportation Needs: Not on file  ?Physical Activity: Not on file  ?Stress: Not on file  ?Social Connections: Not on file  ?Intimate Partner Violence: Not on file  ? ? ?PHYSICAL EXAM ? ?Vitals:  ? 07/16/21 1330  ?BP: 111/74  ?Pulse: 74  ?Weight: 237 lb (107.5 kg)  ?Height: '5\' 4"'$  (1.626 m)  ? ?Body mass index is 40.68 kg/m?. ? ?Generalized: Well developed, in no acute distress  ?Neurological examination  ?Mentation: Alert oriented to time, place, history taking. Follows all commands speech and language fluent ?Cranial nerve II-XII: Pupils were equal round reactive to light. Extraocular movements were full, visual field were full on confrontational test. Facial sensation and strength were normal. Head turning and shoulder shrug were normal and symmetric. ?Motor: Good strength overall, decreased left hip flexion ?Sensory: decreased soft touch bilateral lower extremities  ?Coordination: Cerebellar testing reveals good finger-nose-finger and heel-to-shin bilaterally.  ?Gait and station: Has to push off from seated position, limp on the left, relies on her  cane ?Reflexes: Deep tendon reflexes are symmetric and normal bilaterally.  ? ?DIAGNOSTIC DATA (LABS, IMAGING, TESTING) ?- I reviewed patient records, labs, notes, testing and imaging myself where available. ? ?Lab

## 2021-08-03 ENCOUNTER — Ambulatory Visit (HOSPITAL_COMMUNITY): Payer: Medicare HMO | Attending: Cardiovascular Disease

## 2021-08-03 DIAGNOSIS — I10 Essential (primary) hypertension: Secondary | ICD-10-CM

## 2021-08-03 DIAGNOSIS — I7121 Aneurysm of the ascending aorta, without rupture: Secondary | ICD-10-CM

## 2021-08-03 LAB — ECHOCARDIOGRAM COMPLETE
Area-P 1/2: 2.3 cm2
P 1/2 time: 719 msec
S' Lateral: 3.3 cm

## 2021-08-06 DIAGNOSIS — E119 Type 2 diabetes mellitus without complications: Secondary | ICD-10-CM | POA: Diagnosis not present

## 2021-08-06 DIAGNOSIS — J019 Acute sinusitis, unspecified: Secondary | ICD-10-CM | POA: Diagnosis not present

## 2021-08-06 DIAGNOSIS — N3000 Acute cystitis without hematuria: Secondary | ICD-10-CM | POA: Diagnosis not present

## 2021-08-27 DIAGNOSIS — M25562 Pain in left knee: Secondary | ICD-10-CM | POA: Insufficient documentation

## 2021-08-28 DIAGNOSIS — M1712 Unilateral primary osteoarthritis, left knee: Secondary | ICD-10-CM | POA: Diagnosis not present

## 2021-08-28 DIAGNOSIS — E119 Type 2 diabetes mellitus without complications: Secondary | ICD-10-CM | POA: Diagnosis not present

## 2021-09-03 DIAGNOSIS — R300 Dysuria: Secondary | ICD-10-CM | POA: Diagnosis not present

## 2021-10-07 DIAGNOSIS — Z86010 Personal history of colonic polyps: Secondary | ICD-10-CM | POA: Diagnosis not present

## 2021-10-07 DIAGNOSIS — E1169 Type 2 diabetes mellitus with other specified complication: Secondary | ICD-10-CM | POA: Diagnosis not present

## 2021-10-07 DIAGNOSIS — Z0000 Encounter for general adult medical examination without abnormal findings: Secondary | ICD-10-CM | POA: Diagnosis not present

## 2021-10-07 DIAGNOSIS — E785 Hyperlipidemia, unspecified: Secondary | ICD-10-CM | POA: Diagnosis not present

## 2021-10-07 DIAGNOSIS — Z79899 Other long term (current) drug therapy: Secondary | ICD-10-CM | POA: Diagnosis not present

## 2021-10-07 DIAGNOSIS — I10 Essential (primary) hypertension: Secondary | ICD-10-CM | POA: Diagnosis not present

## 2021-10-07 DIAGNOSIS — D692 Other nonthrombocytopenic purpura: Secondary | ICD-10-CM | POA: Diagnosis not present

## 2021-10-07 DIAGNOSIS — I4510 Unspecified right bundle-branch block: Secondary | ICD-10-CM | POA: Diagnosis not present

## 2021-10-07 DIAGNOSIS — R350 Frequency of micturition: Secondary | ICD-10-CM | POA: Diagnosis not present

## 2021-10-07 DIAGNOSIS — M810 Age-related osteoporosis without current pathological fracture: Secondary | ICD-10-CM | POA: Diagnosis not present

## 2021-10-13 ENCOUNTER — Other Ambulatory Visit: Payer: Self-pay | Admitting: Cardiovascular Disease

## 2021-10-13 NOTE — Telephone Encounter (Signed)
Rx request sent to pharmacy.  

## 2021-12-04 DIAGNOSIS — Z23 Encounter for immunization: Secondary | ICD-10-CM | POA: Diagnosis not present

## 2021-12-04 DIAGNOSIS — L309 Dermatitis, unspecified: Secondary | ICD-10-CM | POA: Diagnosis not present

## 2021-12-04 DIAGNOSIS — N390 Urinary tract infection, site not specified: Secondary | ICD-10-CM | POA: Diagnosis not present

## 2022-02-03 DIAGNOSIS — H26493 Other secondary cataract, bilateral: Secondary | ICD-10-CM | POA: Diagnosis not present

## 2022-02-03 DIAGNOSIS — Z961 Presence of intraocular lens: Secondary | ICD-10-CM | POA: Diagnosis not present

## 2022-02-03 DIAGNOSIS — H04123 Dry eye syndrome of bilateral lacrimal glands: Secondary | ICD-10-CM | POA: Diagnosis not present

## 2022-02-03 DIAGNOSIS — E119 Type 2 diabetes mellitus without complications: Secondary | ICD-10-CM | POA: Diagnosis not present

## 2022-02-25 DIAGNOSIS — M7712 Lateral epicondylitis, left elbow: Secondary | ICD-10-CM | POA: Insufficient documentation

## 2022-02-25 DIAGNOSIS — R52 Pain, unspecified: Secondary | ICD-10-CM | POA: Diagnosis not present

## 2022-02-25 DIAGNOSIS — G5621 Lesion of ulnar nerve, right upper limb: Secondary | ICD-10-CM | POA: Diagnosis not present

## 2022-02-25 DIAGNOSIS — G5623 Lesion of ulnar nerve, bilateral upper limbs: Secondary | ICD-10-CM | POA: Diagnosis not present

## 2022-02-25 DIAGNOSIS — G5622 Lesion of ulnar nerve, left upper limb: Secondary | ICD-10-CM | POA: Diagnosis not present

## 2022-02-25 DIAGNOSIS — M65322 Trigger finger, left index finger: Secondary | ICD-10-CM | POA: Diagnosis not present

## 2022-03-25 DIAGNOSIS — R52 Pain, unspecified: Secondary | ICD-10-CM | POA: Diagnosis not present

## 2022-03-25 DIAGNOSIS — M65322 Trigger finger, left index finger: Secondary | ICD-10-CM | POA: Diagnosis not present

## 2022-03-25 DIAGNOSIS — M7712 Lateral epicondylitis, left elbow: Secondary | ICD-10-CM | POA: Diagnosis not present

## 2022-03-25 DIAGNOSIS — R0781 Pleurodynia: Secondary | ICD-10-CM | POA: Diagnosis not present

## 2022-03-25 DIAGNOSIS — G5623 Lesion of ulnar nerve, bilateral upper limbs: Secondary | ICD-10-CM | POA: Diagnosis not present

## 2022-04-28 DIAGNOSIS — B356 Tinea cruris: Secondary | ICD-10-CM | POA: Diagnosis not present

## 2022-04-28 DIAGNOSIS — E119 Type 2 diabetes mellitus without complications: Secondary | ICD-10-CM | POA: Diagnosis not present

## 2022-04-28 DIAGNOSIS — Z6841 Body Mass Index (BMI) 40.0 and over, adult: Secondary | ICD-10-CM | POA: Diagnosis not present

## 2022-05-04 DIAGNOSIS — Z6841 Body Mass Index (BMI) 40.0 and over, adult: Secondary | ICD-10-CM | POA: Diagnosis not present

## 2022-05-04 DIAGNOSIS — R81 Glycosuria: Secondary | ICD-10-CM | POA: Diagnosis not present

## 2022-05-04 DIAGNOSIS — G8929 Other chronic pain: Secondary | ICD-10-CM | POA: Diagnosis not present

## 2022-05-04 DIAGNOSIS — Z9989 Dependence on other enabling machines and devices: Secondary | ICD-10-CM | POA: Diagnosis not present

## 2022-05-04 DIAGNOSIS — R399 Unspecified symptoms and signs involving the genitourinary system: Secondary | ICD-10-CM | POA: Diagnosis not present

## 2022-05-04 DIAGNOSIS — M545 Low back pain, unspecified: Secondary | ICD-10-CM | POA: Diagnosis not present

## 2022-05-06 ENCOUNTER — Emergency Department (HOSPITAL_BASED_OUTPATIENT_CLINIC_OR_DEPARTMENT_OTHER): Payer: Medicare HMO

## 2022-05-06 ENCOUNTER — Other Ambulatory Visit: Payer: Self-pay

## 2022-05-06 ENCOUNTER — Emergency Department (HOSPITAL_BASED_OUTPATIENT_CLINIC_OR_DEPARTMENT_OTHER)
Admission: EM | Admit: 2022-05-06 | Discharge: 2022-05-06 | Disposition: A | Discharge status: Home / Self Care | Payer: Medicare HMO | Attending: Emergency Medicine | Admitting: Emergency Medicine

## 2022-05-06 ENCOUNTER — Encounter (HOSPITAL_BASED_OUTPATIENT_CLINIC_OR_DEPARTMENT_OTHER): Payer: Self-pay | Admitting: Emergency Medicine

## 2022-05-06 DIAGNOSIS — Z7902 Long term (current) use of antithrombotics/antiplatelets: Secondary | ICD-10-CM | POA: Insufficient documentation

## 2022-05-06 DIAGNOSIS — K5730 Diverticulosis of large intestine without perforation or abscess without bleeding: Secondary | ICD-10-CM | POA: Insufficient documentation

## 2022-05-06 DIAGNOSIS — Z79899 Other long term (current) drug therapy: Secondary | ICD-10-CM | POA: Diagnosis not present

## 2022-05-06 DIAGNOSIS — M545 Low back pain, unspecified: Secondary | ICD-10-CM | POA: Diagnosis not present

## 2022-05-06 DIAGNOSIS — M5459 Other low back pain: Secondary | ICD-10-CM | POA: Diagnosis not present

## 2022-05-06 DIAGNOSIS — I700 Atherosclerosis of aorta: Secondary | ICD-10-CM | POA: Insufficient documentation

## 2022-05-06 DIAGNOSIS — E119 Type 2 diabetes mellitus without complications: Secondary | ICD-10-CM | POA: Diagnosis not present

## 2022-05-06 DIAGNOSIS — R109 Unspecified abdominal pain: Secondary | ICD-10-CM | POA: Diagnosis not present

## 2022-05-06 LAB — CBC WITH DIFFERENTIAL/PLATELET
Abs Immature Granulocytes: 0.05 10*3/uL (ref 0.00–0.07)
Basophils Absolute: 0 10*3/uL (ref 0.0–0.1)
Basophils Relative: 1 %
Eosinophils Absolute: 0.1 10*3/uL (ref 0.0–0.5)
Eosinophils Relative: 1 %
HCT: 42.1 % (ref 36.0–46.0)
Hemoglobin: 13.8 g/dL (ref 12.0–15.0)
Immature Granulocytes: 1 %
Lymphocytes Relative: 23 %
Lymphs Abs: 1.7 10*3/uL (ref 0.7–4.0)
MCH: 29.9 pg (ref 26.0–34.0)
MCHC: 32.8 g/dL (ref 30.0–36.0)
MCV: 91.1 fL (ref 80.0–100.0)
Monocytes Absolute: 0.7 10*3/uL (ref 0.1–1.0)
Monocytes Relative: 9 %
Neutro Abs: 4.8 10*3/uL (ref 1.7–7.7)
Neutrophils Relative %: 65 %
Platelets: ADEQUATE 10*3/uL (ref 150–400)
RBC: 4.62 MIL/uL (ref 3.87–5.11)
RDW: 13.2 % (ref 11.5–15.5)
WBC: 7.3 10*3/uL (ref 4.0–10.5)
nRBC: 0 % (ref 0.0–0.2)

## 2022-05-06 LAB — URINALYSIS, ROUTINE W REFLEX MICROSCOPIC
Bilirubin Urine: NEGATIVE
Glucose, UA: >1000 mg/dL — AB
Ketones, ur: NEGATIVE mg/dL
Leukocytes,Ua: NEGATIVE
Nitrite: NEGATIVE
Specific Gravity, Urine: 1.028 (ref 1.005–1.030)
pH: 5.5 (ref 5.0–8.0)

## 2022-05-06 LAB — COMPREHENSIVE METABOLIC PANEL
ALT: 9 U/L (ref 0–44)
AST: 19 U/L (ref 15–41)
Albumin: 4.3 g/dL (ref 3.5–5.0)
Alkaline Phosphatase: 76 U/L (ref 38–126)
Anion gap: 12 (ref 5–15)
BUN: 12 mg/dL (ref 8–23)
CO2: 23 mmol/L (ref 22–32)
Calcium: 9.1 mg/dL (ref 8.9–10.3)
Chloride: 106 mmol/L (ref 98–111)
Creatinine, Ser: 0.56 mg/dL (ref 0.44–1.00)
GFR, Estimated: >60 mL/min (ref >60)
Glucose, Bld: 125 mg/dL — ABNORMAL HIGH (ref 70–99)
Potassium: 4.2 mmol/L (ref 3.5–5.1)
Sodium: 141 mmol/L (ref 135–145)
Total Bilirubin: 1.4 mg/dL — ABNORMAL HIGH (ref 0.3–1.2)
Total Protein: 7.2 g/dL (ref 6.5–8.1)

## 2022-05-06 LAB — LIPASE, BLOOD: Lipase: 15 U/L (ref 11–51)

## 2022-05-06 MED ORDER — LIDOCAINE 5 % EX PTCH
1.0000 | MEDICATED_PATCH | CUTANEOUS | Status: DC
  Administered 2022-05-06: 1 via TRANSDERMAL
  Filled 2022-05-06: qty 1

## 2022-05-06 MED ORDER — MORPHINE SULFATE (PF) 4 MG/ML IV SOLN
4.0000 mg | Freq: Once | INTRAVENOUS | Status: AC
  Administered 2022-05-06: 4 mg via INTRAVENOUS
  Filled 2022-05-06: qty 1

## 2022-05-06 MED ORDER — HYDROCODONE-ACETAMINOPHEN 5-325 MG PO TABS
1.0000 | ORAL_TABLET | Freq: Once | ORAL | Status: AC
  Administered 2022-05-06: 1 via ORAL
  Filled 2022-05-06: qty 1

## 2022-05-06 MED ORDER — HYDROCODONE-ACETAMINOPHEN 5-325 MG PO TABS: 1.0000 | ORAL_TABLET | Freq: Four times a day (QID) | ORAL | 0 refills | Status: AC | PRN

## 2022-05-06 MED ORDER — ONDANSETRON HCL 4 MG/2ML IJ SOLN
4.0000 mg | Freq: Once | INTRAMUSCULAR | Status: AC
  Administered 2022-05-06: 4 mg via INTRAVENOUS
  Filled 2022-05-06: qty 2

## 2022-05-06 NOTE — ED Notes (Signed)
Dc instructions reviewed with patient. Patient voiced understanding. Dc with belongings.  °

## 2022-05-06 NOTE — ED Notes (Signed)
Patient transported to CT 

## 2022-05-06 NOTE — ED Triage Notes (Addendum)
Pt arrives to ED with c/o lower back pain that started on 2/12. She notes she is being treated for a UTI since 2/13. Notes she also may have strained back due to picking up dog this week. She notes urinary frequency and slight incontinence. She also notes constipation w/ last BM on 2/13. She notes starting Jardiance on 2/9.

## 2022-05-06 NOTE — ED Provider Notes (Signed)
[facility] Provider Note   CSN: [id number] Arrival date & time: 05/06/22  [id number]     History  Chief Complaint  Patient presents with   Back Pain    Stephanie Luna is a 77 y.o. female.  She has history of multiple medical problems including diabetes, chronic back pain with a compression fracture and neuropathy.  She also history of kidney stone x 2 in the past.  She is she does have chronic back pain but has been unbearable for the past 2 days is radiating to her left flank and slightly to the right flank in the low back.  She went to her PCP couple days ago who was diagnosed with a UTI with symptoms of frequency and urgency.  Pain is worse when she tries to walk.  She is not having nausea or vomiting, no fevers or chills.  Has any new numbness or tingling, no saddle anesthesia or paresthesia.  She reports she has occasional urinary incontinence because she cannot quite make it to her toilet because of the pain but states this is a chronic problem, slightly worse and attributed this to the UTI.  She is on Keflex for UTI   Back Pain      Home Medications Prior to Admission medications   Medication Sig Start Date End Date Taking? Authorizing Provider  HYDROcodone-acetaminophen (NORCO) 5-325 MG tablet Take 1 tablet by mouth every 6 (six) hours as needed for moderate pain. 05/06/22  Yes Monseratt Ledin A, PA-C  acetaminophen (TYLENOL) 500 MG tablet Take 1000 mg by mouth 3 times daily    [provider]  Calcium Citrate-Vitamin D 315-250 MG-UNIT TABS Take 2 tablets by mouth once daily in the evening    [provider]  cephALEXin (KEFLEX) 500 MG capsule Take 500 mg by mouth 2 (two) times daily. 05/04/22   [provider]  cholecalciferol (VITAMIN D) 1000 units tablet Take 1,000 Units by mouth every evening.    [provider]  clopidogrel (PLAVIX) 75 MG tablet Take 75 mg by mouth once daily    [provider]  clotrimazole-betamethasone (LOTRISONE) cream Apply topically 2 (two) times daily. 04/28/22   [provider]  losartan (COZAAR) 50 MG tablet Take 25 mg by mouth once daily    [provider]  metoprolol succinate (TOPROL-XL) 50 MG 24 hr tablet Take '75mg'$  by mouth once daily in the eveing    [provider]  Multiple Vitamin (MULTIVITAMIN) tablet Take 1 tablet by mouth daily.    [provider]  Multiple Vitamins-Minerals (EYE VITAMINS PO) Take 1 tablet by mouth 2 (two) times daily.    [provider]  omeprazole (PRILOSEC) 40 MG capsule Take 40 mg by mouth once daily    [provider]  pioglitazone (ACTOS) 30 MG tablet Take 30 mg by mouth daily. 01/31/20   [provider]  rosuvastatin (CRESTOR) 40 MG tablet TAKE 1 TABLET (40 MG TOTAL) BY MOUTH DAILY (STOP SIMVASTATIN) 10/13/21   Skeet Latch, MD  TRUE [facility] BLOOD GLUCOSE TEST test strip  01/01/20   [provider]  TRUEplus Lancets 28G [place]  01/28/20   [provider]      Allergies    Metformin and related and Codeine    Review of Systems   Review of Systems  Musculoskeletal:  Positive for back pain.    Physical Exam Updated Vital Signs BP 133/68   Pulse 70   Temp 98.5  F (36.9 C) (Oral)   Resp 17   Ht 5' 3.5" (1.613 m)   Wt 97.1 kg   SpO2 95%   BMI 37.31 kg/m  Physical Exam  ED Results / Procedures / Treatments   Labs (all labs ordered are listed, but only abnormal results are displayed) Labs Reviewed  URINALYSIS, ROUTINE W REFLEX MICROSCOPIC - Abnormal; Notable for the following components:      Result Value   Glucose, UA >1,000 (*)    Hgb urine dipstick TRACE (*)    Protein, ur TRACE (*)    Bacteria, UA FEW (*)    All other components within normal limits  COMPREHENSIVE METABOLIC PANEL - Abnormal; Notable for the following components:   Glucose, Bld 125 (*)    Total Bilirubin 1.4 (*)    All other components within  normal limits  CBC WITH DIFFERENTIAL/PLATELET  LIPASE, BLOOD    EKG None  Radiology CT ABDOMEN PELVIS WO CONTRAST  Result Date: 05/06/2022 CLINICAL DATA:  Acute lower back pain. EXAM: CT ABDOMEN AND PELVIS WITHOUT CONTRAST TECHNIQUE: Multidetector CT imaging of the abdomen and pelvis was performed following the standard protocol without IV contrast. RADIATION DOSE REDUCTION: This exam was performed according to the departmental dose-optimization program which includes automated exposure control, adjustment of the mA and/or kV according to patient size and/or use of iterative reconstruction technique. COMPARISON:  March 09, 2016. FINDINGS: Lower chest: No acute abnormality. Hepatobiliary: No focal liver abnormality is seen. Status post cholecystectomy. No biliary dilatation. Pancreas: Unremarkable. No pancreatic ductal dilatation or surrounding inflammatory changes. Spleen: Normal in size without focal abnormality. Adrenals/Urinary Tract: Stable left adrenal adenoma. Right adrenal gland is unremarkable. Stable left renal cyst is noted for which no further follow-up is required. Bilateral renal cortical scarring is noted. No hydronephrosis or renal obstruction is noted. No renal or ureteral calculi are noted. Urinary bladder is unremarkable. Stomach/Bowel: The stomach is unremarkable. There is no evidence of bowel obstruction or inflammation. Diverticulosis of descending and sigmoid colon is noted without inflammation. The appendix is not clearly visualized. Vascular/Lymphatic: Aortic atherosclerosis. No enlarged abdominal or pelvic lymph nodes. Reproductive: Status post hysterectomy. No adnexal masses. Other: No abdominal wall hernia or abnormality. No abdominopelvic ascites. Musculoskeletal: Stable old L1 compression fracture is noted. Stable grade 1 anterolisthesis of L5-S1. No acute osseous abnormality is noted. IMPRESSION: Diverticulosis of descending and sigmoid colon without inflammation. No acute  abnormality seen in the abdomen or pelvis. Aortic Atherosclerosis (ICD10-I70.0). Electronically Signed   By: Marijo Conception M.D.   On: 05/06/2022 12:03    Procedures Procedures    Medications Ordered in ED Medications  morphine (PF) 4 MG/ML injection 4 mg (4 mg Intravenous Given 05/06/22 1035)  ondansetron (ZOFRAN) injection 4 mg (4 mg Intravenous Given 05/06/22 1034)  HYDROcodone-acetaminophen (NORCO/VICODIN) 5-325 MG per tablet 1 tablet (1 tablet Oral Given 05/06/22 1236)    ED Course/ Medical Decision Making/ A&P                             Medical Decision Making This patient presents to the ED for concern of low back pain with some pain going into the left flank, this involves an extensive number of treatment options, and is a complaint that carries with it a high risk of complications and morbidity.  The differential diagnosis includes lumbar radiculopathy, nephrolithiasis, nephritis, diverticulitis, other   Co morbidities that complicate the patient evaluation  History of kidney stones,  diabetes   Additional history obtained:  Additional history obtained from EMR External records from outside source obtained and reviewed including our outpatient visits, recent urinalysis showing UTI   Lab Tests:  I Ordered, and personally interpreted labs.  The pertinent results include: BC CMP and urinalysis are reassuring.  UA is improved from recent when patient was started on Keflex   Imaging Studies ordered:  I ordered imaging studies including CT abdomen pelvis I independently visualized and interpreted imaging which showed no acute findings I agree with the radiologist interpretation       Problem List / ED Course / Critical interventions / Medication management  Patient has chronic low back pain and a known chronic fracture of L1.  Her pain is improved after some pain medications today.  Her friend came in who she lives with and notes that she had been helping lift their  dog who is about 25 pounds because the dog had needed help getting outside.  Patient thinks she may have twisted at that time.  She had no trauma, labs imaging are reassuring.  Patient is being treated for UTI, she does not have a kidney stone or other findings concerning on her workup.  Discussed follow-up with PCP and back specialist and strict return precautions. Patient was treated with small amount of narcotic pain medications because she states she has been told by her doctor to not take NSAIDs, she believes due to significant GI upset.  She does not have any renal limitations.  Discussed cautions when taking narcotics.  She lives with a friend who is here and is able to help her at home. Patient had no signs or symptoms to suggest cord compression today, no infectious signs or symptoms concerning for paraspinous or epidural abscess I ordered medication including morphine for pain Reevaluation of the patient after these medicines showed that the patient improved I have reviewed the patients home medicines and have made adjustments as needed       Amount and/or Complexity of Data Reviewed Labs: ordered. Radiology: ordered.  Risk Prescription drug management.           Final Clinical Impression(s) / ED Diagnoses Final diagnoses:  Acute bilateral low back pain without sciatica    Rx / DC Orders ED Discharge Orders          Ordered    HYDROcodone-acetaminophen (NORCO) 5-325 MG tablet  Every 6 hours PRN        05/06/22 [place], Meliah Appleman A, PA-C 05/06/22 [place], DO 05/18/22 1520

## 2022-05-06 NOTE — Discharge Instructions (Signed)
You were seen today for your back pain. You CT scan did not show any kidney stones. You have an old fracture of your L1 vertebrae. Take the pain medication as discussed and follow up with your PCP and/or the back specialist Come back for any new or worsening symptoms

## 2022-05-07 ENCOUNTER — Other Ambulatory Visit: Payer: Self-pay

## 2022-05-07 ENCOUNTER — Emergency Department (HOSPITAL_COMMUNITY)
Admission: EM | Admit: 2022-05-07 | Discharge: 2022-05-07 | Disposition: A | Discharge status: Home / Self Care | Payer: Medicare HMO | Attending: Emergency Medicine | Admitting: Emergency Medicine

## 2022-05-07 DIAGNOSIS — I10 Essential (primary) hypertension: Secondary | ICD-10-CM | POA: Diagnosis not present

## 2022-05-07 DIAGNOSIS — M5459 Other low back pain: Secondary | ICD-10-CM | POA: Diagnosis not present

## 2022-05-07 DIAGNOSIS — Z7902 Long term (current) use of antithrombotics/antiplatelets: Secondary | ICD-10-CM | POA: Insufficient documentation

## 2022-05-07 DIAGNOSIS — M545 Low back pain, unspecified: Secondary | ICD-10-CM

## 2022-05-07 DIAGNOSIS — Z8673 Personal history of transient ischemic attack (TIA), and cerebral infarction without residual deficits: Secondary | ICD-10-CM | POA: Insufficient documentation

## 2022-05-07 DIAGNOSIS — E119 Type 2 diabetes mellitus without complications: Secondary | ICD-10-CM | POA: Diagnosis not present

## 2022-05-07 DIAGNOSIS — Z79899 Other long term (current) drug therapy: Secondary | ICD-10-CM | POA: Insufficient documentation

## 2022-05-07 DIAGNOSIS — N200 Calculus of kidney: Secondary | ICD-10-CM | POA: Diagnosis not present

## 2022-05-07 MED ORDER — CYCLOBENZAPRINE HCL 10 MG PO TABS: 10.0000 mg | ORAL_TABLET | Freq: Two times a day (BID) | ORAL | 0 refills | Status: AC | PRN

## 2022-05-07 MED ORDER — PREDNISONE 10 MG (21) PO TBPK: ORAL_TABLET | Freq: Every day | ORAL | 0 refills | Status: DC

## 2022-05-07 MED ORDER — CYCLOBENZAPRINE HCL 10 MG PO TABS: 10.0000 mg | ORAL_TABLET | Freq: Two times a day (BID) | ORAL | 0 refills | Status: DC | PRN

## 2022-05-07 NOTE — Discharge Instructions (Addendum)
Please take your medications as prescribed. I recommend close follow-up with ortho for reevaluation.  Please do not hesitate to return to emergency department if worrisome signs symptoms we discussed become apparent.

## 2022-05-07 NOTE — ED Provider Notes (Signed)
[facility] Provider Note   CSN: [id number] Arrival date & time: 05/07/22  1704     History  Chief Complaint  Patient presents with   Back Pain    Stephanie Luna is a 77 y.o. female with a past medical history of diabetes, hypertension, kidney stones, TIA presenting today for evaluation of low back pain.  Patient reports she has had worsening low back pain for 5 days.  States she has back pain before but this is the worst back pain she ever had.  She states she is still able to ambulate at home with significant difficulty.  Patient was evaluated yesterday, was given oxycodone however she reports no improvement.  MRI done last year in April showed chronic compression fracture of L1 and mild spinal stenosis of L3-L4.  States sometimes she has urinary incontinence due to pain she cannot make it to the restroom on time.  No tingling or numbness on her legs.  Back Pain   Past Medical History:  Diagnosis Date   Aneurysm of ascending aorta ([place]) 07/13/2021   CVA (cerebral vascular accident) ([place]) 2015   L parietal lobe, other ischemic infarcts seen, no occlusive vascular disease mentioned   Diabetes mellitus without complication ([place])    HLD (hyperlipidemia)    Hypertension    Kidney stones    Right bundle branch block (RBBB)    TIA (transient ischemic attack)    Tinnitus aurium, bilateral    Past Surgical History:  Procedure Laterality Date   ABDOMINAL HYSTERECTOMY     CATARACT EXTRACTION, BILATERAL     Colocystectomy      exploratory laparotomy      x2 ; 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 Authorizing Provider  cyclobenzaprine (FLEXERIL) 10 MG tablet Take 1 tablet (10 mg total) by mouth 2 (two) times daily as needed for  muscle spasms. 05/07/22  Yes Rex Kras, PA  predniSONE (STERAPRED UNI-PAK 21 TAB) 10 MG (21) TBPK tablet Take by mouth daily. Take 6 tabs by mouth daily  for 2 days, then 5 tabs for 2 days, then 4 tabs for 2 days, then 3 tabs for 2 days, 2 tabs for 2 days, then 1 tab by mouth daily for 2 days 05/07/22  Yes Rex Kras, PA  acetaminophen (TYLENOL) 500 MG tablet Take 1000 mg by mouth 3 times daily    [provider]  Calcium Citrate-Vitamin D 315-250 MG-UNIT TABS Take 2 tablets by mouth once daily in the evening    [provider]  cephALEXin (KEFLEX) 500 MG capsule Take 500 mg by mouth 2 (two) times daily. 05/04/22   [provider]  cholecalciferol (VITAMIN D) 1000 units tablet Take 1,000 Units by mouth every evening.    [provider]  clopidogrel (PLAVIX) 75 MG tablet Take 75 mg by mouth once daily    [provider]  clotrimazole-betamethasone (LOTRISONE) cream Apply topically 2 (two) times daily. 04/28/22   [provider]  HYDROcodone-acetaminophen (NORCO) 5-325 MG tablet Take 1 tablet by mouth every 6 (six) hours as needed for moderate pain. 05/06/22   Sherrye Payor A, PA-C  losartan (COZAAR) 50 MG tablet Take 25 mg by mouth once daily  [provider]  metoprolol succinate (TOPROL-XL) 50 MG 24 hr tablet Take 48m by mouth once daily in the eveing    [provider]  Multiple Vitamin (MULTIVITAMIN) tablet Take 1 tablet by mouth daily.    [provider]  Multiple Vitamins-Minerals (EYE VITAMINS PO) Take 1 tablet by mouth 2 (two) times daily.    [provider]  omeprazole (PRILOSEC) 40 MG capsule Take 40 mg by mouth once daily    [provider]  pioglitazone (ACTOS) 30 MG tablet Take 30 mg by mouth daily. 01/31/20   [provider]  rosuvastatin (CRESTOR) 40 MG tablet TAKE 1 TABLET (40 MG TOTAL) BY MOUTH DAILY (STOP SIMVASTATIN) 10/13/21   RSkeet Latch MD  [facility]  test strip  01/01/20   [provider]  TRUE[facility] Lancets [place] 01/28/20   [provider]      Allergies    Metformin and related and Codeine    Review of Systems   Review of Systems  Musculoskeletal:  Positive for back pain.    Physical Exam Updated Vital Signs BP 121/78 (BP Location: Right Arm)   Pulse 83   Temp 98.3 F (36.8 C) (Oral)   Resp 18   Ht 5' 3.5" (1.613 m)   Wt 97.1 kg   SpO2 95%   BMI 37.31 kg/m  Physical Exam Vitals and nursing note reviewed.  Constitutional:      Appearance: Normal appearance.  HENT:     Head: Normocephalic and atraumatic.     Mouth/Throat:     Mouth: Mucous membranes are moist.  Eyes:     General: No scleral icterus. Cardiovascular:     Rate and Rhythm: Normal rate and regular rhythm.     Pulses: Normal pulses.     Heart sounds: Normal heart sounds.  Pulmonary:     Effort: Pulmonary effort is normal.     Breath sounds: Normal breath sounds.  Abdominal:     General: Abdomen is flat.     Palpations: Abdomen is soft.     Tenderness: There is no abdominal tenderness.  Musculoskeletal:        General: No deformity.     Comments: Tenderness to palpation to lower back.  Skin:    General: Skin is warm.     Findings: No rash.  Neurological:     General: No focal deficit present.     Mental Status: She is alert.  Psychiatric:        Mood and Affect: Mood normal.     ED Results / Procedures / Treatments   Labs (all labs ordered are listed, but only abnormal results are displayed) Labs Reviewed - No data to display  EKG None  Radiology CT ABDOMEN PELVIS WO CONTRAST  Result Date: 05/06/2022 CLINICAL DATA:  Acute lower back pain. EXAM: CT ABDOMEN AND PELVIS WITHOUT CONTRAST TECHNIQUE: Multidetector CT imaging of the abdomen and pelvis was performed following the standard protocol without IV contrast. RADIATION DOSE REDUCTION: This exam was performed according to the departmental dose-optimization program  which includes automated exposure control, adjustment of the mA and/or kV according to patient size and/or use of iterative reconstruction technique. COMPARISON:  March 09, 2016. FINDINGS: Lower chest: No acute abnormality. Hepatobiliary: No focal liver abnormality is seen. Status post cholecystectomy. No biliary dilatation. Pancreas: Unremarkable. No pancreatic ductal dilatation or surrounding inflammatory changes. Spleen: Normal in size without focal abnormality. Adrenals/Urinary Tract: Stable left adrenal adenoma. Right adrenal gland  is unremarkable. Stable left renal cyst is noted for which no further follow-up is required. Bilateral renal cortical scarring is noted. No hydronephrosis or renal obstruction is noted. No renal or ureteral calculi are noted. Urinary bladder is unremarkable. Stomach/Bowel: The stomach is unremarkable. There is no evidence of bowel obstruction or inflammation. Diverticulosis of descending and sigmoid colon is noted without inflammation. The appendix is not clearly visualized. Vascular/Lymphatic: Aortic atherosclerosis. No enlarged abdominal or pelvic lymph nodes. Reproductive: Status post hysterectomy. No adnexal masses. Other: No abdominal wall hernia or abnormality. No abdominopelvic ascites. Musculoskeletal: Stable old L1 compression fracture is noted. Stable grade 1 anterolisthesis of L5-S1. No acute osseous abnormality is noted. IMPRESSION: Diverticulosis of descending and sigmoid colon without inflammation. No acute abnormality seen in the abdomen or pelvis. Aortic Atherosclerosis (ICD10-I70.0). Electronically Signed   By: Marijo Conception M.D.   On: 05/06/2022 12:03    Procedures Procedures    Medications Ordered in ED Medications - No data to display  ED Course/ Medical Decision Making/ A&P                             Medical Decision Making Risk Prescription drug management.   This patient presents to the ED for lower back pain, this involves an extensive  number of treatment options, and is a complaint that carries with a high risk of complications and morbidity.  The differential diagnosis includesUTI, pyelonephritis, renal stone, obstructed stone, infected stone, testicular torsion, epididymitis, incarcerated hernia, STD, musculoskeletal pain, cauda equina. This is not an exhaustive list.  Problem list/ ED course/ Critical interventions/ Medical management: HPI: See above Vital signs within normal range and stable throughout visit. Laboratory/imaging studies significant for: See above. On physical examination, patient is afebrile and appears in no acute distress. This patient presents with back pain most consistent with musculoskeletal pain. Differential diagnoses includes lumbago versus musculoskeletal spasm / strain versus sciatica. Less likely sciatica as straight leg raise test was negative. No back pain red flags on history or physical. Presentation not consistent with malignancy (lack of history of malignancy, lack of B symptoms), fracture (no trauma, no bony tenderness to palpation), cauda equina (no bowel or urinary incontinence/retention, no saddle anesthesia, no distal weakness) viscus perforation, osteomyelitis or epidural abscess (no IVDU, vertebral tenderness), renal colic, pyelonephritis (afebrile, no CVAT, no urinary symptoms). Given the clinical picture, no indication for imaging at this time. I have reviewed the patient home medicines and have made adjustments as needed.  Cardiac monitoring/EKG: The patient was maintained on a cardiac monitor.  I personally reviewed and interpreted the cardiac monitor which showed an underlying rhythm of: sinus rhythm.  Additional history obtained: External records from outside source obtained and reviewed including: Chart review including previous notes, labs, imaging.  Disposition Continued outpatient therapy. Follow-up with ortho recommended for reevaluation of symptoms. Treatment plan discussed  with patient.  Pt acknowledged understanding was agreeable to the plan. Worrisome signs and symptoms were discussed with patient, and patient acknowledged understanding to return to the ED if they noticed these signs and symptoms. Patient was stable upon discharge.   This chart was dictated using voice recognition software.  Despite best efforts to proofread,  errors can occur which can change the documentation meaning.          Final Clinical Impression(s) / ED Diagnoses Final diagnoses:  Acute midline low back pain without sciatica    Rx / DC Orders ED Discharge Orders  Ordered    cyclobenzaprine (FLEXERIL) 10 MG tablet  2 times daily PRN        05/07/22 1841    predniSONE (STERAPRED UNI-PAK 21 TAB) 10 MG (21) TBPK tablet  Daily        05/07/22 1841              Rex Kras, PA 05/08/22 0002    Drenda Freeze, MD 05/08/22 [phone]

## 2022-05-07 NOTE — ED Triage Notes (Signed)
Pt BIB EMS from home c/o lower back pain since Monday. Pt was seen yesterday for same

## 2022-05-12 DIAGNOSIS — S32000A Wedge compression fracture of unspecified lumbar vertebra, initial encounter for closed fracture: Secondary | ICD-10-CM | POA: Diagnosis not present

## 2022-05-12 DIAGNOSIS — E119 Type 2 diabetes mellitus without complications: Secondary | ICD-10-CM | POA: Diagnosis not present

## 2022-05-12 DIAGNOSIS — F330 Major depressive disorder, recurrent, mild: Secondary | ICD-10-CM | POA: Diagnosis not present

## 2022-05-12 DIAGNOSIS — I700 Atherosclerosis of aorta: Secondary | ICD-10-CM | POA: Diagnosis not present

## 2022-05-25 DIAGNOSIS — M4856XD Collapsed vertebra, not elsewhere classified, lumbar region, subsequent encounter for fracture with routine healing: Secondary | ICD-10-CM | POA: Diagnosis not present

## 2022-05-25 DIAGNOSIS — K5903 Drug induced constipation: Secondary | ICD-10-CM | POA: Diagnosis not present

## 2022-05-25 DIAGNOSIS — M4856XA Collapsed vertebra, not elsewhere classified, lumbar region, initial encounter for fracture: Secondary | ICD-10-CM | POA: Insufficient documentation

## 2022-05-25 DIAGNOSIS — M545 Low back pain, unspecified: Secondary | ICD-10-CM | POA: Diagnosis not present

## 2022-05-27 DIAGNOSIS — M5416 Radiculopathy, lumbar region: Secondary | ICD-10-CM | POA: Diagnosis not present

## 2022-05-28 DIAGNOSIS — M4854XD Collapsed vertebra, not elsewhere classified, thoracic region, subsequent encounter for fracture with routine healing: Secondary | ICD-10-CM | POA: Diagnosis not present

## 2022-06-02 ENCOUNTER — Other Ambulatory Visit: Payer: Self-pay | Admitting: Cardiovascular Disease

## 2022-06-02 DIAGNOSIS — R3915 Urgency of urination: Secondary | ICD-10-CM | POA: Diagnosis not present

## 2022-06-02 DIAGNOSIS — R110 Nausea: Secondary | ICD-10-CM | POA: Diagnosis not present

## 2022-06-02 DIAGNOSIS — R339 Retention of urine, unspecified: Secondary | ICD-10-CM | POA: Diagnosis not present

## 2022-06-02 DIAGNOSIS — Z9989 Dependence on other enabling machines and devices: Secondary | ICD-10-CM | POA: Diagnosis not present

## 2022-06-02 DIAGNOSIS — K5900 Constipation, unspecified: Secondary | ICD-10-CM | POA: Diagnosis not present

## 2022-06-02 DIAGNOSIS — N390 Urinary tract infection, site not specified: Secondary | ICD-10-CM | POA: Diagnosis not present

## 2022-06-02 NOTE — Telephone Encounter (Signed)
Rx(s) sent to pharmacy electronically.  

## 2022-06-09 ENCOUNTER — Other Ambulatory Visit (HOSPITAL_COMMUNITY): Payer: Self-pay | Admitting: Interventional Radiology

## 2022-06-09 DIAGNOSIS — M549 Dorsalgia, unspecified: Secondary | ICD-10-CM

## 2022-06-09 DIAGNOSIS — E1140 Type 2 diabetes mellitus with diabetic neuropathy, unspecified: Secondary | ICD-10-CM | POA: Diagnosis not present

## 2022-06-09 DIAGNOSIS — M810 Age-related osteoporosis without current pathological fracture: Secondary | ICD-10-CM | POA: Diagnosis not present

## 2022-06-09 DIAGNOSIS — G9520 Unspecified cord compression: Secondary | ICD-10-CM | POA: Diagnosis not present

## 2022-06-15 ENCOUNTER — Telehealth (HOSPITAL_COMMUNITY): Payer: Self-pay

## 2022-06-15 NOTE — Telephone Encounter (Signed)
Called to schedule consult. Pt would like to hold off right now. She is starting to feel better and moving around a lot more. She will give [place] a call if things change. AB

## 2022-06-23 DIAGNOSIS — N3000 Acute cystitis without hematuria: Secondary | ICD-10-CM | POA: Diagnosis not present

## 2022-06-23 DIAGNOSIS — G252 Other specified forms of tremor: Secondary | ICD-10-CM | POA: Diagnosis not present

## 2022-06-29 DIAGNOSIS — M4856XD Collapsed vertebra, not elsewhere classified, lumbar region, subsequent encounter for fracture with routine healing: Secondary | ICD-10-CM | POA: Diagnosis not present

## 2022-07-05 DIAGNOSIS — M7712 Lateral epicondylitis, left elbow: Secondary | ICD-10-CM | POA: Diagnosis not present

## 2022-07-05 DIAGNOSIS — R52 Pain, unspecified: Secondary | ICD-10-CM | POA: Diagnosis not present

## 2022-07-05 DIAGNOSIS — G5623 Lesion of ulnar nerve, bilateral upper limbs: Secondary | ICD-10-CM | POA: Diagnosis not present

## 2022-07-05 DIAGNOSIS — M65322 Trigger finger, left index finger: Secondary | ICD-10-CM | POA: Diagnosis not present

## 2022-07-07 DIAGNOSIS — Z87442 Personal history of urinary calculi: Secondary | ICD-10-CM | POA: Diagnosis not present

## 2022-07-07 DIAGNOSIS — N390 Urinary tract infection, site not specified: Secondary | ICD-10-CM | POA: Diagnosis not present

## 2022-07-07 DIAGNOSIS — M549 Dorsalgia, unspecified: Secondary | ICD-10-CM | POA: Diagnosis not present

## 2022-07-20 ENCOUNTER — Other Ambulatory Visit: Payer: Self-pay | Admitting: Family Medicine

## 2022-07-20 DIAGNOSIS — M810 Age-related osteoporosis without current pathological fracture: Secondary | ICD-10-CM

## 2022-07-20 DIAGNOSIS — Z1231 Encounter for screening mammogram for malignant neoplasm of breast: Secondary | ICD-10-CM

## 2022-07-27 ENCOUNTER — Telehealth: Payer: Self-pay | Admitting: Neurology

## 2022-07-27 NOTE — Telephone Encounter (Signed)
LVM and sent mychart msg informing pt of need to reschedule 10/11/22 appt- MD out 

## 2022-08-02 DIAGNOSIS — N951 Menopausal and female climacteric states: Secondary | ICD-10-CM | POA: Diagnosis not present

## 2022-08-02 DIAGNOSIS — R29890 Loss of height: Secondary | ICD-10-CM | POA: Diagnosis not present

## 2022-08-02 DIAGNOSIS — Z8781 Personal history of (healed) traumatic fracture: Secondary | ICD-10-CM | POA: Diagnosis not present

## 2022-08-02 DIAGNOSIS — E2839 Other primary ovarian failure: Secondary | ICD-10-CM | POA: Diagnosis not present

## 2022-08-02 DIAGNOSIS — M810 Age-related osteoporosis without current pathological fracture: Secondary | ICD-10-CM | POA: Diagnosis not present

## 2022-08-06 ENCOUNTER — Other Ambulatory Visit (HOSPITAL_COMMUNITY): Payer: Medicare HMO

## 2022-08-20 DIAGNOSIS — M4854XD Collapsed vertebra, not elsewhere classified, thoracic region, subsequent encounter for fracture with routine healing: Secondary | ICD-10-CM | POA: Diagnosis not present

## 2022-08-20 DIAGNOSIS — M546 Pain in thoracic spine: Secondary | ICD-10-CM | POA: Diagnosis not present

## 2022-08-24 ENCOUNTER — Ambulatory Visit: Payer: Medicare HMO

## 2022-08-31 DIAGNOSIS — M810 Age-related osteoporosis without current pathological fracture: Secondary | ICD-10-CM | POA: Diagnosis not present

## 2022-08-31 DIAGNOSIS — E1142 Type 2 diabetes mellitus with diabetic polyneuropathy: Secondary | ICD-10-CM | POA: Diagnosis not present

## 2022-08-31 DIAGNOSIS — E118 Type 2 diabetes mellitus with unspecified complications: Secondary | ICD-10-CM | POA: Diagnosis not present

## 2022-08-31 DIAGNOSIS — S32000A Wedge compression fracture of unspecified lumbar vertebra, initial encounter for closed fracture: Secondary | ICD-10-CM | POA: Diagnosis not present

## 2022-08-31 DIAGNOSIS — I10 Essential (primary) hypertension: Secondary | ICD-10-CM | POA: Diagnosis not present

## 2022-08-31 DIAGNOSIS — E559 Vitamin D deficiency, unspecified: Secondary | ICD-10-CM | POA: Diagnosis not present

## 2022-09-01 DIAGNOSIS — M546 Pain in thoracic spine: Secondary | ICD-10-CM | POA: Diagnosis not present

## 2022-09-03 ENCOUNTER — Other Ambulatory Visit: Payer: Self-pay | Admitting: Cardiovascular Disease

## 2022-09-09 DIAGNOSIS — M810 Age-related osteoporosis without current pathological fracture: Secondary | ICD-10-CM | POA: Diagnosis not present

## 2022-09-13 ENCOUNTER — Encounter: Payer: Self-pay | Admitting: Neurology

## 2022-09-13 ENCOUNTER — Other Ambulatory Visit (HOSPITAL_COMMUNITY): Payer: Self-pay | Admitting: Interventional Radiology

## 2022-09-13 ENCOUNTER — Ambulatory Visit: Payer: Medicare HMO | Admitting: Neurology

## 2022-09-13 VITALS — BP 125/80 | HR 69 | Ht 62.0 in | Wt 213.5 lb

## 2022-09-13 DIAGNOSIS — Z8673 Personal history of transient ischemic attack (TIA), and cerebral infarction without residual deficits: Secondary | ICD-10-CM | POA: Diagnosis not present

## 2022-09-13 DIAGNOSIS — M549 Dorsalgia, unspecified: Secondary | ICD-10-CM

## 2022-09-13 DIAGNOSIS — M4856XD Collapsed vertebra, not elsewhere classified, lumbar region, subsequent encounter for fracture with routine healing: Secondary | ICD-10-CM | POA: Diagnosis not present

## 2022-09-13 DIAGNOSIS — S22080A Wedge compression fracture of T11-T12 vertebra, initial encounter for closed fracture: Secondary | ICD-10-CM

## 2022-09-13 DIAGNOSIS — R251 Tremor, unspecified: Secondary | ICD-10-CM

## 2022-09-13 DIAGNOSIS — M5441 Lumbago with sciatica, right side: Secondary | ICD-10-CM | POA: Diagnosis not present

## 2022-09-13 DIAGNOSIS — M5442 Lumbago with sciatica, left side: Secondary | ICD-10-CM | POA: Diagnosis not present

## 2022-09-13 DIAGNOSIS — R269 Unspecified abnormalities of gait and mobility: Secondary | ICD-10-CM | POA: Diagnosis not present

## 2022-09-13 NOTE — Progress Notes (Signed)
ASSESSMENT AND PLAN 77 y.o. 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, multiple supratentorium small vessel disease Was on Plavix 75 mg daily, now off all antiplatelet agent, advised her to go back on aspirin 81 mg daily or Plavix 75 mg daily, Continue work with PCP for management of vascular risk factors  Worsening low back pain, radiating pain to bilateral lower extremities, gait abnormality, MRI lumbar spine has shown multilevel degenerative changes, evidence of L1 chronic compression fracture no significant canal or foraminal narrowing, reported to new compression fracture, scheduled for kyphoplasty, on chronic pain management, Prescription for rolling walker   Worsening right hand tremor  Mild action component, no parkinsonian features, does not limit her daily function,  Thyroid functional test,   DIAGNOSTIC DATA (LABS, IMAGING, TESTING) - I reviewed patient records, labs, notes, testing and imaging myself where available.    HISTORY  Stephanie Luna is a 77 year old female seen in request by Dr. Duke Salvia, Elmarie Shiley, for evaluation of possible stroke, her primary care physician is Dr.Wolters, Jasmine December, she is accompanied by her friend at today's visit June 05, 2020   I reviewed and summarized the referring note.PMHx. Stroke in 2015, right leg weakness,  Obesity. HTN HLD. DM since 2020 Kidney stone Hx of smoking 1/2 ppd, quit 2014.   She reported a TIA episode in 2015, was treated at [place], she presented with sudden onset right leg weakness, lasting 5 minutes, per patient, MRI of the brain did show new stroke.  She has been treated with Plavix since.   On March 18, 2020, she was playing on her tablet, had a sudden onset right hand and arm heaviness, she has no control of the stylet, last about 3 hours, she denied  language difficulty, denying right leg involvement   She is planning on to have left knee replacement surgery, has chronic left knee pain, since 2021, also complains of worsening low back pain, radiating pain to bilateral hip, especially after bearing weight, can only walk short distance, multiple arthritic pain, also has significant right shoulder pain   UPDATE Jul 22 2020: She is accompanied by her friends at today's clinical visit, continue complains of significant left knee pain, gabapentin has helped her sleep better, but did not help her left knee pain   Personally reviewed MRI of the brain, no acute stroke, evidence of moderate supratentorium small vessel disease left cerebellar lacunar infarction   MRI of lumbar also showed multilevel degenerative changes, chronic compression fracture of L1 with 50 to 60% compression, no significant canal stenosis, variable degree of foraminal narrowing, no evidence of spinal cord or nerve root compression   She continue to complain significant low back pain, mostly at midline, occasional radiating pain to right leg, gait abnormality   Echocardiogram showed no significant abnormality Ultrasound of carotid artery showed no large vessel disease  UPDATE September 13 2022: She is accompanied by her friend at today's visit complains of intermittent right hand tremor, there is no major limitations in her daily activity, she can crochet, clean, puzzle, most noticeable when she using utensil using her right hand    She has worsening low back pain, seen by pain management, reported more compression fractures, on schedule for kyphoplasty,   PHYSICAL EXAM  Vitals:   09/13/22 1416  BP: 125/80  Pulse: 69  Weight: 213 lb 8  oz (96.8 kg)  Height: 5\' 2"  (1.575 m)   Body mass index is 39.05 kg/m.   PHYSICAL EXAMNIATION:  Gen: NAD, conversant, well nourised, well groomed                     Cardiovascular: Regular rate rhythm, no peripheral edema, warm,  nontender. Eyes: Conjunctivae clear without exudates or hemorrhage Neck: Supple, no carotid bruits. Pulmonary: Clear to auscultation bilaterally   NEUROLOGICAL EXAM:  MENTAL STATUS: Speech/cognition: Awake, alert oriented to history taking and casual conversation  CRANIAL NERVES: CN II: Visual fields are full to confrontation.  Pupils are round equal and briskly reactive to light. CN III, IV, VI: extraocular movement are normal. No ptosis. CN V: Facial sensation is intact to pinprick in all 3 divisions bilaterally.  CN VII: Face is symmetric with normal eye closure and smile. CN VIII: Hearing is normal to casual conversation CN IX, X: Palate elevates symmetrically. Phonation is normal. CN XI: Head turning and shoulder shrug are intact CN XII: Tongue is midline with normal movements and no atrophy.  MOTOR: No rigidity no bradykinesia no significant weakness, no significant posturing tremor noted, no difficulty drawing spiral circle or write sentence  REFLEXES: Reflexes are 1 and symmetric at the biceps, triceps, knees, and ankles. Plantar responses are flexor.  SENSORY: Intact to light touch, pinprick, positional and vibratory sensation are intact in fingers and toes.  COORDINATION: Rapid alternating movements and fine finger movements are intact. There is no dysmetria on finger-to-nose and heel-knee-shin.    GAIT/STANCE: She needs push-up to get up from seated position antalgic cautious    REVIEW OF SYSTEMS: Out of a complete 14 system review of symptoms, the patient complains only of the following symptoms, and all other reviewed systems are negative.  See HPI  ALLERGIES: Allergies  Allergen Reactions   Metformin And Related Other (See Comments)    Chest pain   Metformin     Other Reaction(s): chest pressure   Codeine Nausea Only    Other Reaction(s): stomach upset    HOME MEDICATIONS: Outpatient Medications Prior to Visit  Medication Sig Dispense Refill    acetaminophen (TYLENOL) 500 MG tablet Take 1000 mg by mouth 3 times daily     Calcium Citrate-Vitamin D 315-250 MG-UNIT TABS Take 2 tablets by mouth once daily in the evening     cholecalciferol (VITAMIN D) 1000 units tablet Take 1,000 Units by mouth every evening.     clopidogrel (PLAVIX) 75 MG tablet Take 75 mg by mouth once daily     clotrimazole-betamethasone (LOTRISONE) cream Apply topically 2 (two) times daily.     cyclobenzaprine (FLEXERIL) 10 MG tablet Take 1 tablet (10 mg total) by mouth 2 (two) times daily as needed for muscle spasms. 20 tablet 0   HYDROcodone-acetaminophen (NORCO) 5-325 MG tablet Take 1 tablet by mouth every 6 (six) hours as needed for moderate pain. 6 tablet 0   losartan (COZAAR) 50 MG tablet Take 25 mg by mouth once daily     metoprolol succinate (TOPROL-XL) 50 MG 24 hr tablet Take 75mg  by mouth once daily in the eveing     Multiple Vitamin (MULTIVITAMIN) tablet Take 1 tablet by mouth daily.     Multiple Vitamins-Minerals (EYE VITAMINS PO) Take 1 tablet by mouth 2 (two) times daily.     omeprazole (PRILOSEC) 40 MG capsule Take 40 mg by mouth once daily     pioglitazone (ACTOS) 30 MG tablet Take 30 mg by mouth daily.  rosuvastatin (CRESTOR) 40 MG tablet TAKE 1 TABLET EVERY DAY 30 tablet 0   TRUE METRIX BLOOD GLUCOSE TEST test strip      TRUEplus Lancets 28G MISC      cephALEXin (KEFLEX) 500 MG capsule Take 500 mg by mouth 2 (two) times daily.     predniSONE (STERAPRED UNI-PAK 21 TAB) 10 MG (21) TBPK tablet Take by mouth daily. Take 6 tabs by mouth daily  for 2 days, then 5 tabs for 2 days, then 4 tabs for 2 days, then 3 tabs for 2 days, 2 tabs for 2 days, then 1 tab by mouth daily for 2 days 42 tablet 0   No facility-administered medications prior to visit.    PAST MEDICAL HISTORY: Past Medical History:  Diagnosis Date   Aneurysm of ascending aorta (HCC) 07/13/2021   CVA (cerebral vascular accident) (HCC) 2015   L parietal lobe, other ischemic infarcts seen,  no occlusive vascular disease mentioned   Diabetes mellitus without complication (HCC)    HLD (hyperlipidemia)    Hypertension    Kidney stones    Right bundle branch block (RBBB)    TIA (transient ischemic attack)    Tinnitus aurium, bilateral     PAST SURGICAL HISTORY: Past Surgical History:  Procedure Laterality Date   ABDOMINAL HYSTERECTOMY     CATARACT EXTRACTION, BILATERAL     Colocystectomy      exploratory laparotomy      x2 ; 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 ;    MENISECTOMY Left age 34   subseqeuntly did revision to clean out the scar issue years later    TONSILLECTOMY      FAMILY HISTORY: Family History  Problem Relation Age of Onset   Colon cancer Mother    Heart Problems Father    Breast cancer Sister        diagnosed in her 31"s    SOCIAL HISTORY: Social History   Socioeconomic History   Marital status: Widowed    Spouse name: Not on file   Number of children: 1   Years of education: 58   Highest education level: High school graduate  Occupational History   Occupation: Retired  Tobacco Use   Smoking status: Former    Types: Cigarettes    Quit date: 10/03/2013    Years since quitting: 8.9   Smokeless tobacco: Never  Vaping Use   Vaping Use: Never used  Substance and Sexual Activity   Alcohol use: No   Drug use: Never   Sexual activity: Not Currently  Other Topics Concern   Not on file  Social History Narrative   Lives at home with a roommate.   Right-handed.   Occasional soda.   Social Determinants of Health   Financial Resource Strain: Not on file  Food Insecurity: Not on file  Transportation Needs: Not on file  Physical Activity: Not on file  Stress: Not on file  Social Connections: Not on file  Intimate Partner Violence: Not on file   Levert Feinstein, M.D. Ph.D.  [facility] [address] [place], [place] [address] Phone: [phone] Fax:      [phone]

## 2022-09-14 LAB — THYROID PANEL WITH TSH
Free Thyroxine Index: 1.9 (ref 1.2–4.9)
T3 Uptake Ratio: 27 % (ref 24–39)
T4, Total: 6.9 ug/dL (ref 4.5–12.0)
TSH: 2.25 u[IU]/mL (ref 0.450–4.500)

## 2022-09-16 ENCOUNTER — Telehealth (HOSPITAL_COMMUNITY): Payer: Self-pay

## 2022-09-16 NOTE — Telephone Encounter (Signed)
Pt called to cancel consult. She doesn't want to reschedule at this time. She will call when she is ready. AB

## 2022-09-17 ENCOUNTER — Ambulatory Visit (HOSPITAL_COMMUNITY): Payer: Medicare HMO

## 2022-09-17 ENCOUNTER — Encounter (HOSPITAL_COMMUNITY): Payer: Self-pay

## 2022-09-28 ENCOUNTER — Ambulatory Visit
Admission: RE | Admit: 2022-09-28 | Discharge: 2022-09-28 | Disposition: A | Discharge status: Home / Self Care | Payer: Medicare HMO | Source: Ambulatory Visit | Attending: Family Medicine | Admitting: Family Medicine

## 2022-09-28 DIAGNOSIS — Z1231 Encounter for screening mammogram for malignant neoplasm of breast: Secondary | ICD-10-CM | POA: Diagnosis not present

## 2022-09-29 DIAGNOSIS — R350 Frequency of micturition: Secondary | ICD-10-CM | POA: Diagnosis not present

## 2022-10-02 ENCOUNTER — Other Ambulatory Visit: Payer: Self-pay | Admitting: Cardiovascular Disease

## 2022-10-04 NOTE — Telephone Encounter (Signed)
Rx(s) sent to pharmacy electronically.  

## 2022-10-11 ENCOUNTER — Ambulatory Visit: Payer: Medicare HMO | Admitting: Neurology

## 2022-10-11 DIAGNOSIS — G5623 Lesion of ulnar nerve, bilateral upper limbs: Secondary | ICD-10-CM | POA: Diagnosis not present

## 2022-10-11 DIAGNOSIS — M7712 Lateral epicondylitis, left elbow: Secondary | ICD-10-CM | POA: Diagnosis not present

## 2022-10-11 DIAGNOSIS — E119 Type 2 diabetes mellitus without complications: Secondary | ICD-10-CM | POA: Diagnosis not present

## 2022-10-11 DIAGNOSIS — M65322 Trigger finger, left index finger: Secondary | ICD-10-CM | POA: Diagnosis not present

## 2022-10-18 DIAGNOSIS — E785 Hyperlipidemia, unspecified: Secondary | ICD-10-CM | POA: Diagnosis not present

## 2022-10-18 DIAGNOSIS — E119 Type 2 diabetes mellitus without complications: Secondary | ICD-10-CM | POA: Diagnosis not present

## 2022-10-18 DIAGNOSIS — Z79899 Other long term (current) drug therapy: Secondary | ICD-10-CM | POA: Diagnosis not present

## 2022-10-18 DIAGNOSIS — E1169 Type 2 diabetes mellitus with other specified complication: Secondary | ICD-10-CM | POA: Diagnosis not present

## 2022-10-18 DIAGNOSIS — M810 Age-related osteoporosis without current pathological fracture: Secondary | ICD-10-CM | POA: Diagnosis not present

## 2022-10-19 ENCOUNTER — Other Ambulatory Visit: Payer: Self-pay | Admitting: Cardiovascular Disease

## 2022-10-20 DIAGNOSIS — K219 Gastro-esophageal reflux disease without esophagitis: Secondary | ICD-10-CM | POA: Diagnosis not present

## 2022-10-20 DIAGNOSIS — Z9181 History of falling: Secondary | ICD-10-CM | POA: Diagnosis not present

## 2022-10-20 DIAGNOSIS — E785 Hyperlipidemia, unspecified: Secondary | ICD-10-CM | POA: Diagnosis not present

## 2022-10-20 DIAGNOSIS — Z79899 Other long term (current) drug therapy: Secondary | ICD-10-CM | POA: Diagnosis not present

## 2022-10-20 DIAGNOSIS — Z0000 Encounter for general adult medical examination without abnormal findings: Secondary | ICD-10-CM | POA: Diagnosis not present

## 2022-10-20 DIAGNOSIS — M810 Age-related osteoporosis without current pathological fracture: Secondary | ICD-10-CM | POA: Diagnosis not present

## 2022-10-20 DIAGNOSIS — E119 Type 2 diabetes mellitus without complications: Secondary | ICD-10-CM | POA: Diagnosis not present

## 2022-10-20 NOTE — Telephone Encounter (Signed)
Called to discuss scheduling the overdue follow up with Dr. Duke Salvia / APP for medication refills---call could ot be completed--will continue to try and reach patient

## 2022-10-20 NOTE — Telephone Encounter (Signed)
Please call pt to schedule overdue follow-up appointment with Dr. Duke Salvia or APP for refills. Last OV 06/2021. Thank you!

## 2022-10-22 NOTE — Telephone Encounter (Signed)
Scheduled 11/15/22 with Gillian Shields, NP

## 2022-10-22 NOTE — Telephone Encounter (Signed)
Rx request sent to pharmacy.  

## 2022-10-25 DIAGNOSIS — M4854XD Collapsed vertebra, not elsewhere classified, thoracic region, subsequent encounter for fracture with routine healing: Secondary | ICD-10-CM | POA: Diagnosis not present

## 2022-10-25 DIAGNOSIS — M5136 Other intervertebral disc degeneration, lumbar region: Secondary | ICD-10-CM | POA: Diagnosis not present

## 2022-10-25 DIAGNOSIS — M4856XD Collapsed vertebra, not elsewhere classified, lumbar region, subsequent encounter for fracture with routine healing: Secondary | ICD-10-CM | POA: Diagnosis not present

## 2022-11-01 ENCOUNTER — Encounter (HOSPITAL_BASED_OUTPATIENT_CLINIC_OR_DEPARTMENT_OTHER): Payer: Self-pay | Admitting: Family

## 2022-11-01 ENCOUNTER — Ambulatory Visit (HOSPITAL_BASED_OUTPATIENT_CLINIC_OR_DEPARTMENT_OTHER): Payer: Medicare HMO | Admitting: Family

## 2022-11-01 VITALS — BP 114/72 | HR 92 | Ht 62.0 in | Wt 205.0 lb

## 2022-11-01 DIAGNOSIS — E785 Hyperlipidemia, unspecified: Secondary | ICD-10-CM

## 2022-11-01 DIAGNOSIS — I10 Essential (primary) hypertension: Secondary | ICD-10-CM | POA: Diagnosis not present

## 2022-11-01 DIAGNOSIS — I7121 Aneurysm of the ascending aorta, without rupture: Secondary | ICD-10-CM

## 2022-11-01 MED ORDER — ROSUVASTATIN CALCIUM 40 MG PO TABS
40.0000 mg | ORAL_TABLET | Freq: Every day | ORAL | 3 refills | Status: DC
Start: 2022-11-01 — End: 2023-11-11

## 2022-11-01 NOTE — Patient Instructions (Addendum)
,  Medication Instructions:  Continue your current medications.   *If you need a refill on your cardiac medications before your next appointment, please call your pharmacy*  Lab Work: Your recent cholesterol numbers looked great!  Testing/Procedures: Your physician has requested that you have an echocardiogram. Echocardiography is a painless test that uses sound waves to create images of your heart. It provides your doctor with information about the size and shape of your heart and how well your heart's chambers and valves are working. This procedure takes approximately one hour. There are no restrictions for this procedure. Please do NOT wear cologne, perfume, aftershave, or lotions (deodorant is allowed). Please arrive 15 minutes prior to your appointment time.   Follow-Up: At [facility], you and your health needs are our priority.  As part of our continuing mission to provide you with exceptional heart care, we have created designated Provider Care Teams.  These Care Teams include your primary Cardiologist (physician) and Advanced Practice Providers (APPs -  Physician Assistants and Nurse Practitioners) who all work together to provide you with the care you need, when you need it.  We recommend signing up for the patient portal called "MyChart".  Sign up information is provided on this After Visit Summary.  MyChart is used to connect with patients for Virtual Visits (Telemedicine).  Patients are able to view lab/test results, encounter notes, upcoming appointments, etc.  Non-urgent messages can be sent to your provider as well.   To learn more about what you can do with MyChart, go to [email or web address].    Your next appointment:   1 year(s)  Provider:   Chilton Si, MD or Gillian Shields, NP

## 2022-11-01 NOTE — Progress Notes (Unsigned)
Cardiology Office Note:  .   Date:  11/01/2022  ID:  Stephanie Luna, DOB 07-19-1945, MRN [id number] PCP: Mila Palmer, MD  [facility] Providers Cardiologist:  Chilton Si, MD { Click to update primary MD,subspecialty MD or APP then REFRESH:1}   History of Present Illness: .   Stephanie Luna is a 77 y.o. female ***  Presents today for follow up with her friend and roommate.   Multiple spinal compression fractures which is followed by Dr. Ethelene Hal.   Did not tolerate mounjaro nor jardiance. PCP recently stopped Actos with plan to trial lifestyle changes alone to maintain A1c <7.    ROS: Please see the history of present illness.    All other systems reviewed and are negative.   Studies Reviewed: .        Cardiac Studies & Procedures     STRESS TESTS  MYOCARDIAL PERFUSION IMAGING 12/06/2013   ECHOCARDIOGRAM  ECHOCARDIOGRAM COMPLETE 08/03/2021  Narrative ECHOCARDIOGRAM REPORT    Patient Name:   Stephanie Luna [facility] Date of Exam: 08/03/2021 Medical Rec #:  [id number]            Height:       64.0 in Accession #:    [id number]           Weight:       237.0 lb Date of Birth:  1946-02-11           BSA:          2.103 m Patient Age:    75 years             BP:           112/64 mmHg Patient Gender: F                    HR:           67 bpm. Exam Location:  [address]  Procedure: 2D Echo, Cardiac Doppler and Color Doppler  Indications:    I71.21 Ascending aortic aneurysm  History:        Patient has prior history of Echocardiogram examinations, most recent 07/21/2020. Stroke and Obesity, Ascending aortic aneurysm, Arrythmias:RBBB; Risk Factors:Diabetes.  Sonographer:    Samule Ohm RDCS Referring Phys: [id number] Stephanie Luna  IMPRESSIONS   1. Left ventricular ejection fraction, by estimation, is 60 to 65%. The left ventricle has normal function. The left ventricle has no regional wall motion abnormalities. Left ventricular diastolic  parameters are consistent with Grade I diastolic dysfunction (impaired relaxation). 2. Right ventricular systolic function is normal. The right ventricular size is normal. There is normal pulmonary artery systolic pressure. The estimated right ventricular systolic pressure is 22.7 mmHg. 3. The mitral valve is grossly normal. Mild mitral valve regurgitation. No evidence of mitral stenosis. 4. The aortic valve is tricuspid. There is mild calcification of the aortic valve. Aortic valve regurgitation is trivial. Aortic valve sclerosis is present, with no evidence of aortic valve stenosis. 5. Aneurysm of the ascending aorta, measuring 44 mm. 6. The inferior vena cava is normal in size with greater than 50% respiratory variability, suggesting right atrial pressure of 3 mmHg.  Comparison(s): No significant change from prior study. Ascending aorta 44 mm which is similar to prior study.  FINDINGS Left Ventricle: Left ventricular ejection fraction, by estimation, is 60 to 65%. The left ventricle has normal function. The left ventricle has no regional wall motion abnormalities. The left ventricular internal cavity size was normal in size. There is  no left ventricular hypertrophy. Left ventricular diastolic parameters are consistent with Grade I diastolic dysfunction (impaired relaxation).  Right Ventricle: The right ventricular size is normal. No increase in right ventricular wall thickness. Right ventricular systolic function is normal. There is normal pulmonary artery systolic pressure. The tricuspid regurgitant velocity is 2.22 m/s, and with an assumed right atrial pressure of 3 mmHg, the estimated right ventricular systolic pressure is 22.7 mmHg.  Left Atrium: Left atrial size was normal in size.  Right Atrium: Right atrial size was normal in size.  Pericardium: Trivial pericardial effusion is present.  Mitral Valve: The mitral valve is grossly normal. Mild mitral valve regurgitation. No evidence of  mitral valve stenosis.  Tricuspid Valve: The tricuspid valve is grossly normal. Tricuspid valve regurgitation is not demonstrated. No evidence of tricuspid stenosis.  Aortic Valve: The aortic valve is tricuspid. There is mild calcification of the aortic valve. Aortic valve regurgitation is trivial. Aortic regurgitation PHT measures 719 msec. Aortic valve sclerosis is present, with no evidence of aortic valve stenosis.  Pulmonic Valve: The pulmonic valve was grossly normal. Pulmonic valve regurgitation is mild. No evidence of pulmonic stenosis.  Aorta: The aortic root is normal in size and structure. There is an aneurysm involving the ascending aorta measuring 44 mm.  Venous: The right lower pulmonary vein is normal. The inferior vena cava is normal in size with greater than 50% respiratory variability, suggesting right atrial pressure of 3 mmHg.  IAS/Shunts: The atrial septum is grossly normal.   LEFT VENTRICLE PLAX 2D LVIDd:         4.50 cm   Diastology LVIDs:         3.30 cm   LV e' medial:    6.64 cm/s LV PW:         1.30 cm   LV E/e' medial:  11.5 LV IVS:        1.20 cm   LV e' lateral:   10.70 cm/s LVOT diam:     2.20 cm   LV E/e' lateral: 7.1 LV SV:         88 LV SV Index:   42 LVOT Area:     3.80 cm   RIGHT VENTRICLE             IVC RV S prime:     13.40 cm/s  IVC diam: 1.60 cm TAPSE (M-mode): 1.3 cm RVSP:           22.7 mmHg  LEFT ATRIUM             Index        RIGHT ATRIUM           Index LA diam:        3.80 cm 1.81 cm/m   RA Pressure: 3.00 mmHg LA Vol (A2C):   59.3 ml 28.20 ml/m  RA Area:     14.70 cm LA Vol (A4C):   65.1 ml 30.96 ml/m  RA Volume:   32.50 ml  15.46 ml/m LA Biplane Vol: 63.6 ml 30.25 ml/m AORTIC VALVE LVOT Vmax:   115.00 cm/s LVOT Vmean:  72.600 cm/s LVOT VTI:    0.231 m AI PHT:      719 msec  AORTA Ao Root diam: 3.90 cm Ao Asc diam:  4.40 cm  MITRAL VALVE                TRICUSPID VALVE MV Area (PHT): 2.30 cm     TR Peak grad:   19.7  mmHg MV Decel Time: 330 msec     TR Vmax:        222.00 cm/s MV E velocity: 76.50 cm/s   Estimated RAP:  3.00 mmHg MV A velocity: 101.00 cm/s  RVSP:           22.7 mmHg MV E/A ratio:  0.76 SHUNTS Systemic VTI:  0.23 m Systemic Diam: 2.20 cm  Lennie Odor MD Electronically signed by Lennie Odor MD Signature Date/Time: 08/03/2021/1:12:03 PM    Final             Risk Assessment/Calculations:             Physical Exam:   VS:  BP 114/72   Pulse 92   Ht 5\' 2"  (1.575 m)   Wt 205 lb (93 kg)   BMI 37.49 kg/m    Wt Readings from Last 3 Encounters:  11/01/22 205 lb (93 kg)  09/13/22 213 lb 8 oz (96.8 kg)  05/07/22 214 lb (97.1 kg)    GEN: Well nourished, well developed in no acute distress NECK: No JVD; No carotid bruits CARDIAC: ***RRR, no murmurs, rubs, gallops RESPIRATORY:  Clear to auscultation without rales, wheezing or rhonchi  ABDOMEN: Soft, non-tender, non-distended EXTREMITIES:  No edema; No deformity   ASSESSMENT AND PLAN: .    AAneurysm of ascending aorta -   HTN -   Hx of Lacunar Stroke - Continue Plavix, Rosuvastatin. ***    {Are you ordering a CV Procedure (e.g. stress test, cath, DCCV, TEE, etc)?   Press F2        :[id number]}  Dispo: ***with Dr. Duke Salvia or APP  Signed, Alver Sorrow, NP

## 2022-11-03 ENCOUNTER — Encounter (HOSPITAL_BASED_OUTPATIENT_CLINIC_OR_DEPARTMENT_OTHER): Payer: Self-pay | Admitting: Family

## 2022-11-23 ENCOUNTER — Other Ambulatory Visit (HOSPITAL_BASED_OUTPATIENT_CLINIC_OR_DEPARTMENT_OTHER): Payer: Medicare HMO

## 2022-12-16 DIAGNOSIS — R296 Repeated falls: Secondary | ICD-10-CM | POA: Diagnosis not present

## 2022-12-16 DIAGNOSIS — M810 Age-related osteoporosis without current pathological fracture: Secondary | ICD-10-CM | POA: Diagnosis not present

## 2022-12-16 DIAGNOSIS — Z8673 Personal history of transient ischemic attack (TIA), and cerebral infarction without residual deficits: Secondary | ICD-10-CM | POA: Diagnosis not present

## 2022-12-16 DIAGNOSIS — E559 Vitamin D deficiency, unspecified: Secondary | ICD-10-CM | POA: Diagnosis not present

## 2022-12-16 DIAGNOSIS — E118 Type 2 diabetes mellitus with unspecified complications: Secondary | ICD-10-CM | POA: Diagnosis not present

## 2022-12-27 ENCOUNTER — Ambulatory Visit (HOSPITAL_BASED_OUTPATIENT_CLINIC_OR_DEPARTMENT_OTHER): Payer: Medicare HMO

## 2022-12-27 DIAGNOSIS — I7121 Aneurysm of the ascending aorta, without rupture: Secondary | ICD-10-CM

## 2022-12-27 LAB — ECHOCARDIOGRAM COMPLETE
Area-P 1/2: 3.65 cm2
S' Lateral: 2.97 cm

## 2022-12-28 ENCOUNTER — Telehealth (HOSPITAL_BASED_OUTPATIENT_CLINIC_OR_DEPARTMENT_OTHER): Payer: Self-pay

## 2022-12-28 DIAGNOSIS — I7121 Aneurysm of the ascending aorta, without rupture: Secondary | ICD-10-CM

## 2022-12-28 NOTE — Telephone Encounter (Addendum)
Results called to patient who verbalizes understanding! Ct testing for one year ordered.     ----- Message from Alver Sorrow sent at 12/28/2022  7:42 AM EDT ----- Echocardiogram with normal heart pumping function.  No significant valvular abnormalities.  Ascending aorta size increased from 44 to 46 mm.  Continue optimal BP control to prevent progression.  Recommend CT aorta in 1 year for monitoring.  Prefer CT scan for next imaging as more detailed picture given progression in aorta sized based on echo over the past year.

## 2023-01-04 DIAGNOSIS — Z23 Encounter for immunization: Secondary | ICD-10-CM | POA: Diagnosis not present

## 2023-01-29 DIAGNOSIS — N3001 Acute cystitis with hematuria: Secondary | ICD-10-CM | POA: Diagnosis not present

## 2023-01-29 DIAGNOSIS — J029 Acute pharyngitis, unspecified: Secondary | ICD-10-CM | POA: Diagnosis not present

## 2023-01-29 DIAGNOSIS — R509 Fever, unspecified: Secondary | ICD-10-CM | POA: Diagnosis not present

## 2023-01-29 DIAGNOSIS — R350 Frequency of micturition: Secondary | ICD-10-CM | POA: Diagnosis not present

## 2023-01-29 DIAGNOSIS — Z20822 Contact with and (suspected) exposure to covid-19: Secondary | ICD-10-CM | POA: Diagnosis not present

## 2023-03-16 DIAGNOSIS — R0902 Hypoxemia: Secondary | ICD-10-CM | POA: Diagnosis not present

## 2023-03-16 DIAGNOSIS — M47816 Spondylosis without myelopathy or radiculopathy, lumbar region: Secondary | ICD-10-CM | POA: Diagnosis not present

## 2023-03-16 DIAGNOSIS — R29898 Other symptoms and signs involving the musculoskeletal system: Secondary | ICD-10-CM | POA: Diagnosis not present

## 2023-03-16 DIAGNOSIS — R42 Dizziness and giddiness: Secondary | ICD-10-CM | POA: Diagnosis not present

## 2023-03-16 DIAGNOSIS — Z8673 Personal history of transient ischemic attack (TIA), and cerebral infarction without residual deficits: Secondary | ICD-10-CM | POA: Diagnosis not present

## 2023-03-16 DIAGNOSIS — M1712 Unilateral primary osteoarthritis, left knee: Secondary | ICD-10-CM | POA: Diagnosis not present

## 2023-03-16 DIAGNOSIS — G459 Transient cerebral ischemic attack, unspecified: Secondary | ICD-10-CM | POA: Diagnosis not present

## 2023-09-17 ENCOUNTER — Encounter (HOSPITAL_COMMUNITY): Payer: Self-pay | Admitting: Interventional Radiology

## 2023-11-09 ENCOUNTER — Other Ambulatory Visit (HOSPITAL_BASED_OUTPATIENT_CLINIC_OR_DEPARTMENT_OTHER): Payer: Self-pay | Admitting: Family

## 2023-11-09 DIAGNOSIS — E785 Hyperlipidemia, unspecified: Secondary | ICD-10-CM

## 2023-12-23 ENCOUNTER — Encounter: Payer: Self-pay | Admitting: Family
